# Patient Record
Sex: Female | Born: 1995 | Race: Black or African American | Hispanic: No | Marital: Single | Smoking: Never smoker
Health system: Southern US, Community
[De-identification: ages and names within clinical notes are randomized; demographics above are authoritative.]

## PROBLEM LIST (undated history)

## (undated) DIAGNOSIS — R278 Other lack of coordination: Secondary | ICD-10-CM

## (undated) HISTORY — DX: Other lack of coordination: R27.8

---
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
## 2016-01-15 DIAGNOSIS — Z0000 Encounter for general adult medical examination without abnormal findings: Secondary | ICD-10-CM | POA: Diagnosis not present

## 2016-02-27 ENCOUNTER — Ambulatory Visit (INDEPENDENT_AMBULATORY_CARE_PROVIDER_SITE_OTHER): Payer: BLUE CROSS/BLUE SHIELD | Admitting: Pediatrics

## 2016-02-27 ENCOUNTER — Encounter: Payer: Self-pay | Admitting: Pediatrics

## 2016-02-27 VITALS — BP 100/70 | Ht 66.5 in | Wt 157.0 lb

## 2016-02-27 DIAGNOSIS — F902 Attention-deficit hyperactivity disorder, combined type: Secondary | ICD-10-CM

## 2016-02-27 DIAGNOSIS — R278 Other lack of coordination: Secondary | ICD-10-CM | POA: Diagnosis not present

## 2016-02-27 MED ORDER — LISDEXAMFETAMINE DIMESYLATE 50 MG PO CAPS: ORAL_CAPSULE | ORAL | 0 refills | Status: DC

## 2016-02-27 NOTE — Progress Notes (Signed)
[facility]  [facility] [address] [place] [place] [address] Dept: [phone] Dept Fax: [phone] Loc: [phone] Loc Fax: [phone]  Medical Follow-up  Patient ID: Tina Moody, female  DOB: 1996-05-22, 20 y.o.  MRN: [id number]  Date of Evaluation: 02/27/16  PCP: Elon Jester, MD  Accompanied by: Father Patient Lives with: mother, father and [name] 10 years, Laurenity 6 years.  Patient's sisters godchildren.  Moved in for a time due to their family situation. Patient's parents are guardians, staying an indeterminate time since about December 2016. Still long term at this point.  HISTORY/CURRENT STATUS:  Polite and cooperative and present for three month follow up.   Freshman at [employer] will start on August 13th.  Needs letter form completed for DSO at college.   EDUCATION: School: [employer] freshman Year/Grade: Freshman Also looking for employment. Early childhood education [address], Reading and writing contemporary issues with lab Has chores at home  Services: IEP/504 Plan will apply for services in college Activities/Exercise:  Walking with Dad Has chores at home, looking for work.  Has beach trip planned [facility])  MEDICAL HISTORY: Appetite: WNL  Sleep: Bedtime: 2200 -0 2300 Awakens:0700 - 0800 Sleep Concerns: Initiation/Maintenance/Other: Asleep easily, sleeps through the night, feels well-rested.  No Sleep concerns. No concerns for toileting. Daily stool, no constipation or diarrhea. Void urine no difficulty. No enuresis.   Participate in daily oral hygiene to include brushing and flossing.  Individual Medical History/Review of System Changes? Had recent check up  Allergies: Review of patient's allergies indicates no known allergies.  Current Medications:    .  lisdexamfetamine (VYVANSE) 50 MG capsule, Two capsules by mouth daily  Medication Side Effects: None   Family  Medical/Social History Changes?: Yes Extended family living in home  MENTAL HEALTH: Mental Health Issues: Denies sadness, loneliness or depression. No self harm or thoughts of self harm or injury. Denies fears, worries and anxieties. Has good peer relations and is not a bully nor is victimized. Had call from Detectives about one week ago due to an online incident where patient was sent information and received a photo from a much older man on the Internet. Patient did not recall details. Parents were very concerned. Patient had website restrictions for a few years ago, so this happened during that time.  Has restrictions still based.  PHYSICAL EXAM: Vitals:  Today's Vitals   02/27/16 0957  BP: 100/70  Weight: 157 lb (71.2 kg)  Height: 5' 6.5" (1.689 m)  , 79 %ile (Z= 0.81) based on CDC 2-20 Years BMI-for-age data using vitals from 02/27/2016. Body mass index is 24.96 kg/m.  General Exam: Physical Exam  Constitutional: She is oriented to person, place, and time. Vital signs are normal. She appears well-developed and well-nourished. She is cooperative.  HENT:  Head: Normocephalic.  Right Ear: Tympanic membrane, external ear and ear canal normal.  Left Ear: Tympanic membrane, external ear and ear canal normal.  Nose: Nose normal.  Mouth/Throat: Uvula is midline, oropharynx is clear and moist and mucous membranes are normal.  Eyes: Conjunctivae, EOM and lids are normal. Pupils are equal, round, and reactive to light.  Neck: Trachea normal and normal range of motion. Neck supple. No thyroid mass present.  Cardiovascular: Normal rate, regular rhythm, normal heart sounds and normal pulses.   Pulmonary/Chest: Effort normal and breath sounds normal.  Abdominal: Normal appearance.  Genitourinary:  Genitourinary Comments: deferred  Musculoskeletal:       Right  elbow: She exhibits swelling.       Left elbow: She exhibits swelling.  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 She has normal strength and normal reflexes. She displays a negative Romberg sign.  Skin: Skin is warm, dry and intact.  Psychiatric: She has a normal mood and affect. Her speech is normal and behavior is normal. Judgment and thought content normal. Cognition and memory are normal.  Vitals reviewed.  Neurological: oriented to time, place, and person Cranial Nerves: normal  Neuromuscular:  Motor Mass: Normal Tone: Average  Strength: Good DTRs: 2+ and symmetric Overflow: None Reflexes: no tremors noted, finger to nose without dysmetria bilaterally, performs thumb to finger exercise without difficulty, no palmar drift, gait was normal, tandem gait was normal and no ataxic movements noted Sensory Exam: Vibratory: WNL  Fine Touch: WNL  Testing/Developmental Screens:  ASRS 26/19 per mother          DISCUSSION:  Reviewed old records and/or current chart. Reviewed growth and development with anticipatory guidance provided. Reviewed school progress and accommodations. Reviewed medication administration, effects, and possible side effects. ADHD medications discussed to include different medications and pharmacologic properties of each. Recommendation for specific medication to include dose, administration, expected effects, possible side effects and the risk to benefit ratio of medication management. Vyvanse  two daily. Three prescriptions provided, two with fill after dates for 03/19/16 and 04/09/16  Reviewed importance of good sleep hygiene, limited screen time, regular exercise and healthy eating.    DIAGNOSES:    ICD-9-CM ICD-10-CM   1. ADHD (attention deficit hyperactivity disorder), combined type 314.01 F90.2   2. Dysgraphia 781.3 R27.8     RECOMMENDATIONS:  Patient Instructions  Continue medication Vyvanse  - two capsules per day. Three prescriptions provided, two with fill after dates for 03/19/16 and 04/09/16.  Continue  daily exercise.  Excellent weight reduction since last visit.   Mother verbalized understanding of all topics discussed.   NEXT APPOINTMENT: Return in about 3 months (around 05/29/2016).  Medical Decision-making:  More than 50% of the appointment was spent counseling and discussing diagnosis and management of symptoms with the patient and family.  Leticia Penna, NP

## 2016-02-27 NOTE — Patient Instructions (Addendum)
Continue medication Vyvanse 50mg  - two capsules per day. Three prescriptions provided, two with fill after dates for 03/19/16 and 04/09/16.  Continue daily exercise.  Excellent weight reduction since last visit.

## 2016-05-02 DIAGNOSIS — L237 Allergic contact dermatitis due to plants, except food: Secondary | ICD-10-CM | POA: Diagnosis not present

## 2016-05-28 ENCOUNTER — Encounter: Payer: Self-pay | Admitting: Pediatrics

## 2016-05-28 ENCOUNTER — Ambulatory Visit (INDEPENDENT_AMBULATORY_CARE_PROVIDER_SITE_OTHER): Payer: BLUE CROSS/BLUE SHIELD | Admitting: Pediatrics

## 2016-05-28 VITALS — BP 120/80 | Ht 66.5 in | Wt 159.0 lb

## 2016-05-28 DIAGNOSIS — R278 Other lack of coordination: Secondary | ICD-10-CM | POA: Diagnosis not present

## 2016-05-28 DIAGNOSIS — F902 Attention-deficit hyperactivity disorder, combined type: Secondary | ICD-10-CM | POA: Diagnosis not present

## 2016-05-28 MED ORDER — LISDEXAMFETAMINE DIMESYLATE 50 MG PO CAPS: ORAL_CAPSULE | ORAL | 0 refills | Status: DC

## 2016-05-28 NOTE — Progress Notes (Signed)
[facility] [address] [place]. [address] [place] [address] Dept: [phone] Dept Fax: [phone] Loc: [phone] Loc Fax: [phone]  Medical Follow-up  Patient ID: Tina Moody, female  DOB: 18-Jul-1996, 20 y.o.  MRN: [id number]  Date of Evaluation: 05/28/16   PCP: Elon Jester, MD  Accompanied by: Tina Moody Patient Lives with: Tina Moody and Tina Moody  Sister's (35 and out of home) god children are living in the home Tina Moody is 10 years, and Tina Moody is 7 years Living with the family since about one year, sometime at her sister's. Has older brother (24 years)  HISTORY/CURRENT STATUS:  Polite and cooperative and present for three month follow up for routine medication management of ADHD.     EDUCATION: School: [employer]  Year/Grade: Freshman year  English on M, W, F, Early childhood educ T, Th and also Math Performance/Grades: below average teachers have not recorded all the grades Services: IEP/504 Plan DSO at college, can get help if needs help Activities/Exercise: never and likes to xbox "just dance"  MEDICAL HISTORY: Appetite: WNL  Sleep: Bedtime: 2200  Awakens: Up at 0620 on T/TH, later on MWF 0720 Sleep Concerns: Initiation/Maintenance/Other: Asleep easily, sleeps through the night, feels well-rested.  No Sleep concerns. Wants more sleep on M, W, F Has been late, did not miss Dad drives her to school.  Has her permit, not driving in a while  Individual Medical History/Review of System Changes? Yes Right ankle bug bite, had prednisone, now resolved.  Allergies: Review of patient's allergies indicates no known allergies.  Current Medications:  Current Outpatient Prescriptions:  .  Ibuprofen 200 MG CAPS, Take 200 mg by mouth as needed., Disp: , Rfl:  .  lisdexamfetamine (VYVANSE) 50 MG capsule, Two capsules by mouth daily, Disp: 60  capsule, Rfl: 0 .  VENTOLIN HFA 108 (90 Base) MCG/ACT inhaler, 2 PUFFS AS NEEDED EVERY 4 HRS INHALATION 30 DAYS, Disp: , Rfl: 0 Medication Side Effects: None  Family Medical/Social History Changes?: No  MENTAL HEALTH: Mental Health Issues:  Denies sadness, loneliness or depression. No self harm or thoughts of self harm or injury. Denies fears, worries and anxieties. Has good peer relations and is not a bully nor is victimized.   PHYSICAL EXAM: Vitals:  Today's Vitals   05/28/16 1658  BP: 120/80  Weight: 159 lb (72.1 kg)  Height: 5' 6.5" (1.689 m)  , 80 %ile (Z= 0.85) based on CDC 2-20 Years BMI-for-age data using vitals from 05/28/2016. Body mass index is 25.28 kg/m.  Review of Systems  Neurological: Negative for headaches.  Psychiatric/Behavioral: Negative for depression. The patient is not nervous/anxious.   All other systems reviewed and are negative.  General Exam: Physical Exam  Constitutional: She is oriented to person, place, and time. Vital signs are normal. She appears well-developed and well-nourished. She is cooperative. No distress.  HENT:  Head: Normocephalic.  Right Ear: Tympanic membrane, external ear and ear canal normal.  Left Ear: Tympanic membrane, external ear and ear canal normal.  Nose: Nose normal.  Mouth/Throat: Uvula is midline, oropharynx is clear and moist and mucous membranes are normal.  Eyes: Conjunctivae, EOM and lids are normal. Pupils are equal, round, and reactive to light.  Neck: Trachea normal and normal range of motion.  Cardiovascular: Normal rate, regular rhythm, normal heart sounds and normal pulses.   Pulmonary/Chest: Effort normal and breath sounds normal.  Abdominal: Soft. Normal appearance and bowel sounds are  normal.  Genitourinary:  Genitourinary Comments: Deferred  Musculoskeletal: Normal range of motion.  Neurological: She is alert and oriented to person, place, and time. She has normal strength and normal reflexes. She  displays no tremor. No cranial nerve deficit or sensory deficit. She displays a negative Romberg sign. She displays no seizure activity. Coordination and gait normal.  Skin: Skin is warm, dry and intact.  Psychiatric: She has a normal mood and affect. Her speech is normal and behavior is normal. Judgment and thought content normal. Her mood appears not anxious. She is not hyperactive. Cognition and memory are normal. She does not express impulsivity. She does not exhibit a depressed mood. She expresses no suicidal ideation. She expresses no suicidal plans. She is attentive.  Vitals reviewed.   Neurological: oriented to time, place, and person Cranial Nerves: normal  Neuromuscular:  Motor Mass: Normal Tone: Average  Strength: Good DTRs: 2+ and symmetric Overflow: None Reflexes: no tremors noted, finger to nose without dysmetria bilaterally, performs thumb to finger exercise without difficulty, no palmar drift, gait was normal, tandem gait was normal and no ataxic movements noted Sensory Exam: Vibratory: WNL  Fine Touch: WNL   Testing/Developmental Screens: ASRS 24/22       DISCUSSION:  Reviewed old records and/or current chart. Reviewed growth and development with anticipatory guidance provided. Reviewed school progress and accommodations. Has college services would benefit from understanding learn style and strengths.  Low to boarderline scores at testing done in 2010. Aspires for early childhood education. Reviewed medication administration, effects, and possible side effects.  ADHD medications discussed to include different medications and pharmacologic properties of each. Recommendation for specific medication to include dose, administration, expected effects, possible side effects and the risk to benefit ratio of medication management. Vyvanse 50 mg, two capsule each am. Reviewed importance of good sleep hygiene, limited screen time, regular exercise and healthy  eating.  DIAGNOSES:    ICD-9-CM ICD-10-CM   1. ADHD (attention deficit hyperactivity disorder), combined type 314.01 F90.2 Ambulatory referral to Pediatric Psychology  2. Dysgraphia 781.3 R27.8 Ambulatory referral to Pediatric Psychology    RECOMMENDATIONS:  Patient Instructions  Continue medication as directed.  Vyvanse 50 mg, two capsules each morning Three prescriptions provided, two with fill after dates for 06/18/16 and 07/09/16   Psychoeducational testing is recommended to either be updated to get a better understanding of learning style and strengths.   In addition, testing would allow the child to fully realize their potential which may be beneficial in motivating towards academic goals.     PHYSICAL ACTIVITY INFORMATION AND RESOURCES    It is important to know that:  . Nearly half of American youths aged 12-21 years are not vigorously active on a regular basis. . About 14 percent of young people report no recent physical activity. Inactivity is more common among females (14%) than males (7%) and among black females (21%) than white females (12%)  The Youth Physical Activity Guidelines are as follows: Children and adolescents should have 60 minutes (1 hour) or more of physical activity daily. . Aerobic: Most of the 60 or more minutes a day should be either moderate- or vigorous-intensity aerobic physical activity and should include vigorous-intensity physical activity at least 3 days a week. . Muscle-strengthening: As part of their 60 or more minutes of daily physical activity, children and adolescents should include muscle-strengthening physical activity on at least 3 days of the week. . Bone-strengthening: As part of their 60 or more minutes of daily physical activity,  children and adolescents should include bone-strengthening physical activity on at least 3 days of the week. This infographic provides examples of activities:   LumberShow.glhttp://health.gov/paguidelines/midcourse/youth-fact-sheet.pdf  Additional Information and Resources:  CoupleSeminar.co.nzhttp://www.cdc.gov/healthyschools/physicalactivity/guidelines.htm OrthoTraffic.chhttp://www.cdc.gov/nccdphp/sgr/adoles.htm ThemeLizard.nohttp://mchb.hrsa.gov/mchirc/_pubs/us_teens/main_pages/ch_2.htm https://www.mccoy-hunt.com/http://www.who.int/dietphysicalactivity/factsheet_young_people/en/ http://www.guthyjacksonfoundation.org/five-health-fitness-smartphone-apps-for-nmo/?gclid=CNTMuZvp3ccCFVc7gQod7HsAvw (phone apps)  Local Resources:  [place] [employer] [place] Youth Services Guide (Recreation and IT sales professionalxtra Curricular Activities on pages 30-33): http://www.[place].gov/modules/showdocument.aspx?documentid=18016 Summer Night Lights: http://www.[place]-.gov/index.aspx?page=4004  Go Far Club: BasicJet.cahttp://www.gofarclub.org/      Tina Moody and patient verbalized understanding all topics.   NEXT APPOINTMENT: Return in about 3 months (around 08/28/2016) for Medical Follow up.   Leticia PennaBobi A Isella Slatten, NP Counseling Time: 40 Total Contact Time: 50

## 2016-05-28 NOTE — Patient Instructions (Addendum)
Continue medication as directed.  Vyvanse 50 mg, two capsules each morning Three prescriptions provided, two with fill after dates for 06/18/16 and 07/09/16   Psychoeducational testing is recommended to either be updated to get a better understanding of learning style and strengths.   In addition, testing would allow the child to fully realize their potential which may be beneficial in motivating towards academic goals.     PHYSICAL ACTIVITY INFORMATION AND RESOURCES    It is important to know that:  . Nearly half of American youths aged 12-21 years are not vigorously active on a regular basis. . About 14 percent of young people report no recent physical activity. Inactivity is more common among females (14%) than males (7%) and among black females (21%) than white females (12%)  The Youth Physical Activity Guidelines are as follows: Children and adolescents should have 60 minutes (1 hour) or more of physical activity daily. . Aerobic: Most of the 60 or more minutes a day should be either moderate- or vigorous-intensity aerobic physical activity and should include vigorous-intensity physical activity at least 3 days a week. . Muscle-strengthening: As part of their 60 or more minutes of daily physical activity, children and adolescents should include muscle-strengthening physical activity on at least 3 days of the week. . Bone-strengthening: As part of their 60 or more minutes of daily physical activity, children and adolescents should include bone-strengthening physical activity on at least 3 days of the week. This infographic provides examples of activities:  LumberShow.glhttp://health.gov/paguidelines/midcourse/youth-fact-sheet.pdf  Additional Information and Resources:   CoupleSeminar.co.nzhttp://www.cdc.gov/healthyschools/physicalactivity/guidelines.htm OrthoTraffic.chhttp://www.cdc.gov/nccdphp/sgr/adoles.htm ThemeLizard.nohttp://mchb.hrsa.gov/mchirc/_pubs/us_teens/main_pages/ch_2.htm https://www.mccoy-hunt.com/http://www.who.int/dietphysicalactivity/factsheet_young_people/en/ http://www.guthyjacksonfoundation.org/five-health-fitness-smartphone-apps-for-nmo/?gclid=CNTMuZvp3ccCFVc7gQod7HsAvw (phone apps)  Local Resources:  [place] [employer] [place] Youth Services Guide (Recreation and IT sales professionalxtra Curricular Activities on pages 30-33): http://www.[place].gov/modules/showdocument.aspx?documentid=18016 Summer Night Lights: http://www.[place].gov/index.aspx?page=4004  Go Far Club: BasicJet.cahttp://www.gofarclub.org/

## 2016-07-31 ENCOUNTER — Encounter: Payer: Self-pay | Admitting: Pediatrics

## 2016-07-31 ENCOUNTER — Ambulatory Visit (INDEPENDENT_AMBULATORY_CARE_PROVIDER_SITE_OTHER): Payer: BLUE CROSS/BLUE SHIELD | Admitting: Pediatrics

## 2016-07-31 VITALS — BP 120/80 | Ht 66.5 in | Wt 156.0 lb

## 2016-07-31 DIAGNOSIS — F902 Attention-deficit hyperactivity disorder, combined type: Secondary | ICD-10-CM

## 2016-07-31 DIAGNOSIS — R278 Other lack of coordination: Secondary | ICD-10-CM | POA: Diagnosis not present

## 2016-07-31 DIAGNOSIS — Q999 Chromosomal abnormality, unspecified: Secondary | ICD-10-CM

## 2016-07-31 MED ORDER — LISDEXAMFETAMINE DIMESYLATE 50 MG PO CAPS: ORAL_CAPSULE | ORAL | 0 refills | Status: DC
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
## 2016-07-31 NOTE — Patient Instructions (Signed)
Continue medication as directed. Vyvanse 50 mg two daily Three prescriptions provided, two with fill after dates for 08/21/16 and 09/11/16  May need social skills training or counseling for social networking/situations. Parent/teen counseling is recommended and may include Family counseling.  Consider the following options: [facility]  http://famsolutions.org/ [phone]  [facility]  http://www.youthfocus.org/home.html 336 [phone]  Additional resources: COUNSELING AGENCIES in [place] (Accepting Medicaid)  [phone]- [phone] service coordination hub Provides information on mental health, intellectual/developmental disabilities & substance abuse services in [place]   [facility] [address]  "[facility]"           [phone] [facility] [address]          [phone] [facility] [address]            [phone]  [facility] [address]            [phone]  [facility] [address]           [phone] [facility] [phone]    [phone]

## 2016-08-09 ENCOUNTER — Telehealth: Payer: Self-pay | Admitting: Family

## 2016-08-09 MED ORDER — VYVANSE 50 MG PO CAPS: ORAL_CAPSULE | ORAL | 0 refills | Status: DC

## 2016-08-09 NOTE — Telephone Encounter (Signed)
T/C with mother regarding insurance coverage of # 60 capsules and resubmitted fax form today with required information completed. Mother to try to call insurance for approval of medication for # 60 and no call back yet . Will leave script for # 30 Vyvanse 50 mg 1 daily until insurance approval for # 60 capsules is completed at front desk with savings card.

## 2016-08-13 ENCOUNTER — Telehealth: Payer: Self-pay | Admitting: Pediatrics

## 2016-08-13 MED ORDER — VYVANSE 60 MG PO CAPS: 60.0000 mg | ORAL_CAPSULE | Freq: Every day | ORAL | 0 refills | Status: DC

## 2016-08-13 MED ORDER — VYVANSE 40 MG PO CAPS: 40.0000 mg | ORAL_CAPSULE | Freq: Every day | ORAL | 0 refills | Status: DC

## 2016-08-13 NOTE — Telephone Encounter (Signed)
Insurance denied vyvanse 50 mg, #60 caps Will try  vyvanse 40 mg, #30 vyvanse 60 mg #30 Printed and up front for mother to pick up

## 2016-08-20 ENCOUNTER — Other Ambulatory Visit: Payer: Self-pay | Admitting: Pediatrics

## 2016-08-20 MED ORDER — LISDEXAMFETAMINE DIMESYLATE 70 MG PO CAPS: 70.0000 mg | ORAL_CAPSULE | Freq: Every day | ORAL | 0 refills | Status: DC

## 2016-08-20 MED ORDER — LISDEXAMFETAMINE DIMESYLATE 30 MG PO CAPS
30.0000 mg | ORAL_CAPSULE | Freq: Every evening | ORAL | 0 refills | Status: DC
Start: 2016-08-20 — End: 2016-10-30

## 2016-08-20 NOTE — Telephone Encounter (Signed)
Mother would like RX for Vyvanse 70 mg for school days and 30 mg as needed for long college days or evening classes. Printed Rx and placed at front desk for pick-up

## 2016-08-21 ENCOUNTER — Telehealth: Payer: Self-pay | Admitting: Pediatrics

## 2016-08-21 NOTE — Telephone Encounter (Signed)
[facility] [phone]. Qty denied for two per day. PA for Vyvanse 70 mg one daily requested (#30). On call operator said "pending clinical review", I requested to speak with pharmacy review, transferred and into phone tree [phone])  PA # [id number] - when transferred this was not correct and they were unable to locate the patient with the member ID. Located by name and DOB, the member ID needed the Member code   ID# [id number]  Unable to approve, would not send to pharmacy for review.  Waste of time. On phone since 0945, time now [phone].  PA pending

## 2016-08-21 NOTE — Telephone Encounter (Signed)
Received fax from [facility] requesting prior authorization for Vyvanse 70 mg.  Patient last seen 07/31/16, next appointment 10/22/16.

## 2016-08-22 NOTE — Telephone Encounter (Signed)
Received denial.  Called [facility] started call at [phone] Received Approval after much hassle and needing to speak to a supervisor because [facility] did not enter the data correctly.  Approved Expired Jan 2019 PA [id number].

## 2016-08-27 ENCOUNTER — Encounter: Payer: Self-pay | Admitting: Pediatrics

## 2016-10-01 DIAGNOSIS — J029 Acute pharyngitis, unspecified: Secondary | ICD-10-CM | POA: Diagnosis not present

## 2016-10-01 DIAGNOSIS — B349 Viral infection, unspecified: Secondary | ICD-10-CM | POA: Diagnosis not present

## 2016-10-22 ENCOUNTER — Telehealth: Payer: Self-pay | Admitting: Pediatrics

## 2016-10-22 ENCOUNTER — Institutional Professional Consult (permissible substitution): Payer: BLUE CROSS/BLUE SHIELD | Admitting: Pediatrics

## 2016-10-22 NOTE — Telephone Encounter (Signed)
I called the home number and spoke with Dad. Pt is at school. Dad said they forgot about the apt and he was just looking up our number to call us to let us know that they forgot and will not be here for the apt. He rescheduled for next Wednesday at 3 pm. He is aware of the ns/-48 charge. jd

## 2016-10-30 ENCOUNTER — Ambulatory Visit (INDEPENDENT_AMBULATORY_CARE_PROVIDER_SITE_OTHER): Payer: BLUE CROSS/BLUE SHIELD | Admitting: Pediatrics

## 2016-10-30 ENCOUNTER — Encounter: Payer: Self-pay | Admitting: Pediatrics

## 2016-10-30 VITALS — BP 130/97 | HR 90 | Ht 66.0 in | Wt 159.0 lb

## 2016-10-30 DIAGNOSIS — F902 Attention-deficit hyperactivity disorder, combined type: Secondary | ICD-10-CM

## 2016-10-30 DIAGNOSIS — R278 Other lack of coordination: Secondary | ICD-10-CM | POA: Diagnosis not present

## 2016-10-30 MED ORDER — LISDEXAMFETAMINE DIMESYLATE 70 MG PO CAPS: 70.0000 mg | ORAL_CAPSULE | Freq: Every day | ORAL | 0 refills | Status: DC

## 2016-10-30 NOTE — Progress Notes (Signed)
[facility] [address], [place]. [address] [place] [address] Dept: [phone] Dept Fax: [phone] Loc: [phone] Loc Fax: [phone]  Medical Follow-up  Patient ID: Tina Moody, female  DOB: June 29, 1996, 21 y.o.  MRN: [id number]  Date of Evaluation: 10/30/16   PCP: Leanor Rubenstein, MD  Accompanied by: Father Patient Lives with: mother, father and [name] 10 years and [name] 7 years (Sister's god children)  During the school week the kids are with this family, often they are at her sister's.  HISTORY/CURRENT STATUS:  Polite and cooperative and present for three month follow up for routine medication management of ADHD. Currently taking Vyvanse 70 mg daily, unable to obtain #60 per month due to insurance restrictions.  Needs better coverage to inlcude either 50, two daily or 70/30 daily. Currently staying with Celine Ahr, mother is on cruise and father has to work.  Going to beach with her sister and the children for the weekend.    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  MEDICAL HISTORY: Appetite: WNL  Sleep: Bedtime: 2400 no school, school nights change and goes to bed by 2200 Awakens: later on breaks, often depends on what time she went to bed. Sleep Concerns: Initiation/Maintenance/Other: Asleep easily, sleeps through the night, feels well-rested.  No Sleep concerns. Some night awakens, no reason can return back to bed. No concerns for toileting. Daily stool, no constipation or diarrhea. Void urine no difficulty. No enuresis.   Participate in daily oral hygiene to include brushing and flossing.  Individual Medical History/Review of System Changes? Yes was sic and had PCP visit in  March, no meds.  No notes to review.  Was not flu or strep, most just URI  Allergies: Patient has no known allergies.  Current Medications:  Current Outpatient Prescriptions:  .  Ibuprofen 200 MG CAPS, Take 200 mg by mouth as needed., Disp: , Rfl:  .  lisdexamfetamine (VYVANSE) 70 MG capsule, Take 1 capsule (70 mg total) by mouth daily., Disp: 30 capsule, Rfl: 0 .  VENTOLIN HFA 108 (90 Base) MCG/ACT inhaler, 2 PUFFS AS NEEDED EVERY 4 HRS INHALATION 30 DAYS, Disp: , Rfl: 0 Medication Side Effects: None  Family Medical/Social History Changes?: No  MENTAL HEALTH: Mental Health Issues:  Denies sadness, loneliness or depression. No self harm or thoughts of self harm or injury. Denies fears, worries and anxieties. Has good peer relations and is not a bully nor is victimized. Wants to have more independence and "talking to people" on line, had boyfriend mother didn't know about.  Her discussion of what a friend and boyfriend are, seems a bit immature.  Has friends at school. Hangs at home and with family. Aspires to own a daycare  Review of Systems  Musculoskeletal: Positive for joint swelling.       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 Occasional feet  Neurological: Negative for tremors, seizures, weakness and headaches.  Psychiatric/Behavioral: Negative for dysphoric mood, sleep disturbance and suicidal ideas. The patient is not nervous/anxious.   All other systems reviewed and are negative.   PHYSICAL EXAM: Vitals:  Today's Vitals   10/30/16 1511  BP: (!) 130/97  Pulse: 90  Weight: 159 lb (72.1 kg)  Height:  (1.676 m)  , Facility age limit for growth percentiles  is 20 years.  Body mass index is 25.66 kg/m.   General Exam: Physical Exam  Constitutional: She is oriented to person, place, and time. Vital signs are normal. She appears well-developed and well-nourished. She is cooperative. No distress.  HENT:  Head: Normocephalic.  Right Ear: Tympanic membrane,  external ear and ear canal normal.  Left Ear: Tympanic membrane, external ear and ear canal normal.  Nose: Nose normal.  Mouth/Throat: Uvula is midline, oropharynx is clear and moist and mucous membranes are normal.  Eyes: Conjunctivae, EOM and lids are normal. Pupils are equal, round, and reactive to light.  Neck: Trachea normal and normal range of motion.  Cardiovascular: Normal rate, regular rhythm, normal heart sounds and normal pulses.   Pulmonary/Chest: Effort normal and breath sounds normal.  Abdominal: Soft. Normal appearance and bowel sounds are normal.  Genitourinary:  Genitourinary Comments: Deferred  Musculoskeletal: Normal range of motion.  Neurological: She is alert and oriented to person, place, and time. She has normal strength and normal reflexes. She displays no tremor. No cranial nerve deficit or sensory deficit. She displays a negative Romberg sign. She displays no seizure activity. Coordination and gait normal.  Skin: Skin is warm, dry and intact.  Psychiatric: She has a normal mood and affect. Her speech is normal and behavior is normal. Judgment and thought content normal. Her mood appears not anxious. She is not hyperactive. Cognition and memory are normal. She does not express impulsivity. She does not exhibit a depressed mood. She expresses no suicidal ideation. She expresses no suicidal plans. She is attentive.  Vitals reviewed.   Neurological: oriented to time, place, and person Cranial Nerves: normal  Neuromuscular:  Motor Mass: Normal Tone: Average  Strength: Good DTRs: 2+ and symmetric Overflow: None Reflexes: no tremors noted, finger to nose without dysmetria bilaterally, performs thumb to finger exercise without difficulty, no palmar drift, gait was normal, tandem gait was normal and no ataxic movements noted Sensory Exam: Vibratory: WNL  Fine Touch: WNL  Developmental testing: ASRS: 20/15   DISCUSSION:  Reviewed old records and/or current  chart.  Reviewed growth and development with anticipatory guidance provided. Consider social skills, self-defense and counseling due to social emotional immaturity. Has restrictions such as not being allowed to be home alone, due to immature choices.  Seems okay with this.  Reviewed school progress and accommodations.  Reviewed medication administration, effects, and possible side effects.  ADHD medications discussed to include different medications and pharmacologic properties of each. Recommendation for specific medication to include dose, administration, expected effects, possible side effects and the risk to benefit ratio of medication management. Vyvanse 70 mg  Reviewed importance of good sleep hygiene, limited screen time, regular exercise and healthy eating.   DIAGNOSES:    ICD-9-CM ICD-10-CM   1. ADHD (attention deficit hyperactivity disorder), combined type 314.01 F90.2   2. Dysgraphia 781.3 R27.8     RECOMMENDATIONS:  Patient Instructions  Continue medication as directed. Vyvanse 30 mg daily Three prescriptions provided, two with fill after dates for 11/20/16 and 12/11/16  PHYSICAL ACTIVITY INFORMATION AND RESOURCES    It is important to know that:  . Nearly half of American youths aged 12-21 years are not vigorously active on a regular basis. . About 14 percent of young people report no recent physical activity. Inactivity is more common among females (14%) than males (7%) and among black females (21%) than white females (12%)  The Youth Physical Activity Guidelines are as follows: Children and adolescents should have 60 minutes (  1 hour) or more of physical activity daily. . Aerobic: Most of the 60 or more minutes a day should be either moderate- or vigorous-intensity aerobic physical activity and should include vigorous-intensity physical activity at least 3 days a week. . Muscle-strengthening: As part of their 60 or more minutes of daily physical activity, children and  adolescents should include muscle-strengthening physical activity on at least 3 days of the week. . Bone-strengthening: As part of their 60 or more minutes of daily physical activity, children and adolescents should include bone-strengthening physical activity on at least 3 days of the week. This infographic provides examples of activities:  LumberShow.gl.pdf  Additional Information and Resources:  CoupleSeminar.co.nz.htm OrthoTraffic.ch.htm ThemeLizard.no https://www.mccoy-hunt.com/ http://www.guthyjacksonfoundation.org/five-health-fitness-smartphone-apps-for-nmo/?gclid=CNTMuZvp3ccCFVc7gQod7HsAvw (phone apps)  Local Resources:  [place] [employer] Guide (Recreation and IT sales professional Activities on pages 30-33): http://www.[place].gov/modules/showdocument.aspx?documentid=18016 Summer Night Lights: http://www.[place].gov/index.aspx?page=4004  Go Far Club: BasicJet.ca     Father verbalized understanding of all topics discussed.   NEXT APPOINTMENT: Return in about 3 months (around 01/29/2017) for Medical Follow up. Medical Decision-making: More than 50% of the appointment was spent counseling and discussing diagnosis and management of symptoms with the patient and family.   Leticia Penna, NP Counseling Time: 40 Total Contact Time: 50

## 2016-10-30 NOTE — Patient Instructions (Signed)
Continue medication as directed. Vyvanse 30 mg daily Three prescriptions provided, two with fill after dates for 11/20/16 and 12/11/16  PHYSICAL ACTIVITY INFORMATION AND RESOURCES    It is important to know that:  . Nearly half of American youths aged 20-21 years are not vigorously active on a regular basis. . About 14 percent of young people report no recent physical activity. Inactivity is more common among females (14%) than males (7%) and among black females (21%) than white females (12%)  The Youth Physical Activity Guidelines are as follows: Children and adolescents should have 60 minutes (1 hour) or more of physical activity daily. . Aerobic: Most of the 60 or more minutes a day should be either moderate- or vigorous-intensity aerobic physical activity and should include vigorous-intensity physical activity at least 3 days a week. . Muscle-strengthening: As part of their 60 or more minutes of daily physical activity, children and adolescents should include muscle-strengthening physical activity on at least 3 days of the week. . Bone-strengthening: As part of their 60 or more minutes of daily physical activity, children and adolescents should include bone-strengthening physical activity on at least 3 days of the week. This infographic provides examples of activities:  LumberShow.gl.pdf  Additional Information and Resources:  CoupleSeminar.co.nz.htm OrthoTraffic.ch.htm ThemeLizard.no https://www.mccoy-hunt.com/ http://www.guthyjacksonfoundation.org/five-health-fitness-smartphone-apps-for-nmo/?gclid=CNTMuZvp3ccCFVc7gQod7HsAvw (phone apps)  Local Resources:  [place] [employer] Guide (Recreation and IT sales professional Activities on pages 30-33):  http://www.[place].gov/modules/showdocument.aspx?documentid=18016 Summer Night Lights: http://www.[place].gov/index.aspx?page=4004  Go Far Club: BasicJet.ca

## 2017-01-28 ENCOUNTER — Other Ambulatory Visit: Payer: Self-pay | Admitting: Pediatrics

## 2017-01-28 MED ORDER — LISDEXAMFETAMINE DIMESYLATE 70 MG PO CAPS: 70.0000 mg | ORAL_CAPSULE | Freq: Every day | ORAL | 0 refills | Status: DC

## 2017-01-28 NOTE — Telephone Encounter (Signed)
Mom called for refill, did not specify medication.  Needs as soon as possible.  Patient last seen 10/30/16, next appointment 02/06/17.

## 2017-01-28 NOTE — Telephone Encounter (Signed)
Printed Rx and placed at front desk for pick-up-Vyvanse 70 mg daily 

## 2017-02-06 ENCOUNTER — Encounter: Payer: Self-pay | Admitting: Pediatrics

## 2017-02-06 ENCOUNTER — Ambulatory Visit (INDEPENDENT_AMBULATORY_CARE_PROVIDER_SITE_OTHER): Payer: BLUE CROSS/BLUE SHIELD | Admitting: Pediatrics

## 2017-02-06 VITALS — BP 133/88 | HR 108 | Ht 66.75 in | Wt 159.0 lb

## 2017-02-06 DIAGNOSIS — Z719 Counseling, unspecified: Secondary | ICD-10-CM | POA: Diagnosis not present

## 2017-02-06 DIAGNOSIS — R278 Other lack of coordination: Secondary | ICD-10-CM | POA: Diagnosis not present

## 2017-02-06 DIAGNOSIS — Q999 Chromosomal abnormality, unspecified: Secondary | ICD-10-CM | POA: Diagnosis not present

## 2017-02-06 DIAGNOSIS — F902 Attention-deficit hyperactivity disorder, combined type: Secondary | ICD-10-CM | POA: Diagnosis not present

## 2017-02-06 MED ORDER — LISDEXAMFETAMINE DIMESYLATE 70 MG PO CAPS: 70.0000 mg | ORAL_CAPSULE | Freq: Every day | ORAL | 0 refills | Status: DC

## 2017-02-06 NOTE — Progress Notes (Signed)
[facility] [address], [place] [place] [address] Dept: [phone] Dept Fax: [phone] Loc: [phone] Loc Fax: [phone]  Medical Follow-up  Patient ID: Tina BladeGenesis R Bethune, female  DOB: [birth year], 21 y.o.  MRN: [id number]  Date of Evaluation: 02/06/17   PCP: [name], Vyvyan, MD  Accompanied by: self Patient Lives with: mother, father and Rainy 7, Malichai 11 years  HISTORY/CURRENT STATUS:  Chief Complaint - Polite and cooperative and present for medical follow up for medication management of ADHD, dysgraphia and learning differences. Last visit in April 2018 and prescribed Vyvanse 70 mg daily. Discussed grades and issues in college.  Patient is not sure if she was actually receiving credit for F grades.     EDUCATION: School: [employer] classes last spring Math Early Childhood  Did not Pass, but did not do well, did not get credit But "they are letting me take more classes next semester"  On-line transcript shows F grades and under "credit" shows 3 per class, not sure if that means they were earned or not Transcript on phone is not the best.  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  MEDICAL HISTORY: Appetite: WNL  Sleep: Bedtime: 2200 Awakens: 0800 Sleep Concerns: Initiation/Maintenance/Other: Asleep easily, sleeps through the night, feels well-rested.  No Sleep concerns. No concerns for toileting. Daily stool, no constipation or diarrhea. Void urine no difficulty. No enuresis.   Participate in daily oral hygiene to include brushing and flossing.  Individual Medical History/Review of System Changes? No  Allergies: Patient has no known allergies.  Current Medications:  Current Outpatient Prescriptions:  .  lisdexamfetamine (VYVANSE) 70 MG capsule, Take 1 capsule (70 mg total) by  mouth daily., Disp: 30 capsule, Rfl: 0 .  Ibuprofen 200 MG CAPS, Take 200 mg by mouth as needed., Disp: , Rfl:  .  VENTOLIN HFA 108 (90 Base) MCG/ACT inhaler, 2 PUFFS AS NEEDED EVERY 4 HRS INHALATION 30 DAYS, Disp: , Rfl: 0 Medication Side Effects: None  Family Medical/Social History Changes?: No  MENTAL HEALTH: Mental Health Issues: Denies sadness, loneliness or depression. No self harm or thoughts of self harm or injury. Denies fears, worries and anxieties. Has good peer relations and is not a bully nor is victimized.  Review of Systems  Constitutional: Negative.   HENT: Negative.   Cardiovascular: Negative.   Gastrointestinal: Negative.   Endocrine: Negative.   Genitourinary: Negative.   Musculoskeletal: Positive for joint swelling.  Skin: Negative.   Allergic/Immunologic: Negative.   Neurological: Negative for seizures and headaches.  Hematological: Negative.   Psychiatric/Behavioral: Positive for decreased concentration. Negative for behavioral problems, dysphoric mood, self-injury, sleep disturbance and suicidal ideas. The patient is not nervous/anxious and is not hyperactive.   All other systems reviewed and are negative.   PHYSICAL EXAM: Vitals:  Today's Vitals   02/06/17 0954  BP: 133/88  Pulse: (!) 108  Weight: 159 lb (72.1 kg)  Height: 5' 6.75" (1.695 m)  , Facility age limit for growth percentiles is 20 years. Body mass index is 25.09 kg/m.  General Exam: Physical Exam  Constitutional: She is oriented to person, place, and time. Vital signs are normal. She appears well-developed and well-nourished. She is cooperative. No distress.  HENT:  Head: Normocephalic.  Right Ear: Tympanic membrane, external ear and ear canal normal.  Left Ear: Tympanic membrane, external ear and ear canal normal.  Nose: Nose normal.  Mouth/Throat: Uvula is midline, oropharynx is clear and moist and mucous membranes are normal.  Eyes: Pupils are equal, round, and reactive to light.  Conjunctivae, EOM and lids are normal.  Neck: Trachea normal and normal range of motion.  Cardiovascular: Normal rate, regular rhythm, normal heart sounds and normal pulses.   Pulmonary/Chest: Effort normal and breath sounds normal.  Abdominal: Soft. Normal appearance and bowel sounds are normal.  Genitourinary:  Genitourinary Comments: Deferred  Musculoskeletal: Normal range of motion.  Lymphadenopathy:       Right: Epitrochlear adenopathy present.       Left: Epitrochlear adenopathy present.  Bilateral foot swelling  Neurological: She is alert and oriented to person, place, and time. She has normal strength and normal reflexes. She displays no tremor. No cranial nerve deficit or sensory deficit. She displays a negative Romberg sign. She displays no seizure activity. Coordination and gait normal.  Skin: Skin is warm, dry and intact.  Psychiatric: She has a normal mood and affect. Her speech is normal and behavior is normal. Judgment and thought content normal. Her mood appears not anxious. She is not hyperactive. Cognition and memory are normal. She does not express impulsivity. She does not exhibit a depressed mood. She expresses no suicidal ideation. She expresses no suicidal plans. She is attentive.  Vitals reviewed.   Neurological: oriented to time, place, and person  Testing/Developmental Screens: ZOX:WRUE 17/13  Reviewed with patient      DIAGNOSES:    ICD-10-CM   1. ADHD (attention deficit hyperactivity disorder), combined type F90.2   2. 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 Patient counseled Z71.9     RECOMMENDATIONS:  Patient Instructions  DISCUSSION: Patient and family counseled regarding the following coordination of care items:  Continue medication  Vyvanse 70 mg daily, every morning Three prescriptions provided, two with fill after dates for 02/26/17 and 03/19/17  Counseled medication administration, effects, and possible side effects.  ADHD  medications discussed to include different medications and pharmacologic properties of each. Recommendation for specific medication to include dose, administration, expected effects, possible side effects and the risk to benefit ratio of medication management.  Advised importance of:  Good sleep hygiene (8- 10 hours per night) Limited screen time (none on school nights, no more than 2 hours on weekends) Regular exercise(outside and active play) Healthy eating (drink water, no sodas/sweet tea, limit portions and no seconds).  Counseled regarding brain maturation and development.  Encouraged patient to speak with college advisor regarding grades and whether she earned the credits.  Counseled regarding safe driving, no distractions and no phones.      Patient verbalized understanding of all topics discussed.   NEXT APPOINTMENT: Return in about 3 months (around 05/09/2017) for Medical Follow up. Medical Decision-making: More than 50% of the appointment was spent counseling and discussing diagnosis and management of symptoms with the patient and family.   Leticia Penna, NP Counseling Time: 40 Total Contact Time: 50

## 2017-02-06 NOTE — Patient Instructions (Addendum)
DISCUSSION: Patient and family counseled regarding the following coordination of care items:  Continue medication  Vyvanse 70 mg daily, every morning Three prescriptions provided, two with fill after dates for 02/26/17 and 03/19/17  Counseled medication administration, effects, and possible side effects.  ADHD medications discussed to include different medications and pharmacologic properties of each. Recommendation for specific medication to include dose, administration, expected effects, possible side effects and the risk to benefit ratio of medication management.  Advised importance of:  Good sleep hygiene (8- 10 hours per night) Limited screen time (none on school nights, no more than 2 hours on weekends) Regular exercise(outside and active play) Healthy eating (drink water, no sodas/sweet tea, limit portions and no seconds).  Counseled regarding brain maturation and development.  Encouraged patient to speak with college advisor regarding grades and whether she earned the credits.  Counseled regarding safe driving, no distractions and no phones.

## 2017-04-15 DIAGNOSIS — Z118 Encounter for screening for other infectious and parasitic diseases: Secondary | ICD-10-CM | POA: Diagnosis not present

## 2017-04-15 DIAGNOSIS — Z01419 Encounter for gynecological examination (general) (routine) without abnormal findings: Secondary | ICD-10-CM | POA: Diagnosis not present

## 2017-04-15 DIAGNOSIS — Z6826 Body mass index (BMI) 26.0-26.9, adult: Secondary | ICD-10-CM | POA: Diagnosis not present

## 2017-05-27 ENCOUNTER — Ambulatory Visit (INDEPENDENT_AMBULATORY_CARE_PROVIDER_SITE_OTHER): Payer: BLUE CROSS/BLUE SHIELD | Admitting: Pediatrics

## 2017-05-27 ENCOUNTER — Encounter: Payer: Self-pay | Admitting: Pediatrics

## 2017-05-27 VITALS — BP 149/97 | HR 109 | Ht 66.25 in | Wt 164.0 lb

## 2017-05-27 DIAGNOSIS — Z719 Counseling, unspecified: Secondary | ICD-10-CM

## 2017-05-27 DIAGNOSIS — F902 Attention-deficit hyperactivity disorder, combined type: Secondary | ICD-10-CM

## 2017-05-27 DIAGNOSIS — Z7189 Other specified counseling: Secondary | ICD-10-CM | POA: Diagnosis not present

## 2017-05-27 DIAGNOSIS — Q999 Chromosomal abnormality, unspecified: Secondary | ICD-10-CM

## 2017-05-27 DIAGNOSIS — R278 Other lack of coordination: Secondary | ICD-10-CM | POA: Diagnosis not present

## 2017-05-27 DIAGNOSIS — Z79899 Other long term (current) drug therapy: Secondary | ICD-10-CM | POA: Diagnosis not present

## 2017-05-27 MED ORDER — LISDEXAMFETAMINE DIMESYLATE 70 MG PO CAPS: 70.0000 mg | ORAL_CAPSULE | Freq: Every day | ORAL | 0 refills | Status: DC

## 2017-05-27 NOTE — Progress Notes (Signed)
[facility] [place]. [address] [place] [address] Dept: [phone] Dept Fax: [phone] Loc: [phone] Loc Fax: [phone]  Medical Follow-up  Patient ID: Tina Moody, female  DOB: February 11, 1996, 21 y.o.  MRN: [id number]  Date of Evaluation: 05/27/17   PCP: Donald Prose, MD  Accompanied by: self Patient Lives with: mother, father and Rainy 8, Malichai 12 years  HISTORY/CURRENT STATUS:  Chief Complaint - Polite and cooperative and present for medical follow up for medication management of ADHD, dysgraphia and learning differences.  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 Last visit in July 2018 and prescribed Vyvanse 70 mg daily.      EDUCATION: School: [employer] classes ENG 111, Health/Safety and Nutrition and Human Relations/community Was doing child devo 2, but dropped the class due to 4 classes too much Per patient - "Eng - good HSN- good HR -great"  Asked to see the On-line transcript and it shows - ENG 63.4, HSN 54.9  And HR 32.9 She stays for tutoring and extra help. 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 Wants to work at [employer] day [employer]. Has orientation tomorrow and Thursday  MEDICAL HISTORY: Appetite: WNL  Sleep: Bedtime: 2200 Awakens: 0800 Sleep Concerns: Initiation/Maintenance/Other: Asleep easily, sleeps through the night, feels well-rested.  No Sleep concerns. No concerns for toileting. Daily stool, no constipation or diarrhea. Void urine no difficulty. No enuresis.   Participate in daily oral hygiene to include brushing and flossing.  Individual Medical History/Review of System Changes? Yes had OB/Gyn check up.  Allergies: Patient has no known allergies.  Current Medications:  Vyvanse 70  mg Requests no changes. Medication Side Effects: None  Family Medical/Social History Changes?: No  MENTAL HEALTH: Mental Health Issues:  Denies sadness, loneliness or depression. No self harm or thoughts of self harm or injury. Denies fears, worries and anxieties. Has good peer relations and is not a bully nor is victimized.   Review of Systems  Constitutional: Negative.   HENT: Negative.   Cardiovascular: Negative.   Gastrointestinal: Negative.   Endocrine: Negative.   Genitourinary: Negative.   Musculoskeletal: Positive for joint swelling.  Skin: Negative.   Allergic/Immunologic: Negative.   Neurological: Negative for seizures and headaches.  Hematological: Negative.   Psychiatric/Behavioral: Positive for decreased concentration. Negative for behavioral problems, dysphoric mood, self-injury, sleep disturbance and suicidal ideas. The patient is not nervous/anxious and is not hyperactive.   All other systems reviewed and are negative.  Has boyfriend - Merrilee Seashore, met at school Driving is going well - got speeding ticket 79 in a 60.  Wasn't looking down at speed.  Lawyer got a continuance with a new court date next month.  PHYSICAL EXAM: Vitals:  Today's Vitals   05/27/17 1404  BP: (!) 149/97  Pulse: (!) 109  Weight: 164 lb (74.4 kg)  Height: 5' 6.25" (1.683 m)  , Facility age limit for growth percentiles is 20 years. Body mass index is 26.27 kg/m.  General Exam: Physical Exam  Constitutional: She is oriented to person, place, and time. Vital signs are normal. She appears well-developed and well-nourished. She is cooperative. No distress.  HENT:  Head: Normocephalic.  Right Ear: Tympanic membrane, external ear and ear canal normal.  Left Ear: Tympanic membrane, external ear and ear canal normal.  Nose: Nose normal.  Mouth/Throat: Uvula is midline, oropharynx is clear and moist and mucous membranes are normal.  Eyes: Pupils are equal, round, and reactive to light.  Conjunctivae, EOM and lids are normal.  Neck: Trachea normal and normal range of motion.  Cardiovascular: Normal rate, regular rhythm, normal heart sounds and normal pulses.   Pulmonary/Chest: Effort normal and breath sounds normal.  Abdominal: Soft. Normal appearance and bowel sounds are normal.  Genitourinary:  Genitourinary Comments: Deferred  Musculoskeletal: Normal range of motion.  Neurological: She is alert and oriented to person, place, and time. She has normal strength and normal reflexes. She displays no tremor. No cranial nerve deficit or sensory deficit. She displays a negative Romberg sign. She displays no seizure activity. Coordination and gait normal.  Skin: Skin is warm, dry and intact.  Psychiatric: She has a normal mood and affect. Her speech is normal and behavior is normal. Judgment and thought content normal. Her mood appears not anxious. She is not hyperactive. Cognition and memory are normal. She does not express impulsivity. She does not exhibit a depressed mood. She expresses no suicidal ideation. She expresses no suicidal plans. She is attentive.  Vitals reviewed.   Neurological: oriented to time, place, and person  Testing/Developmental Screens: SAY:TKZS 20/14 Reviewed with patient       DIAGNOSES:    ICD-10-CM   1. ADHD (attention deficit hyperactivity disorder), combined type F90.2   2. 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 Medication management Z79.899   5. Counseling and coordination of care Z71.89   6. Patient counseled Z71.9     RECOMMENDATIONS:  Patient Instructions  DISCUSSION: Patient counseled regarding the following coordination of care items:  Continue medication as directed Vyvanse 70 mg daily Three prescriptions provided, two with fill after dates for 06/17/17 and 07/10/17   Counseled medication administration, effects, and possible side effects.  ADHD medications discussed to include different medications and  pharmacologic properties of each. Recommendation for specific medication to include dose, administration, expected effects, possible side effects and the risk to benefit ratio of medication management.  Advised importance of:  Good sleep hygiene (8- 10 hours per night) Limited screen time (none on school nights, no more than 2 hours on weekends) Regular exercise(outside and active play) Healthy eating (drink water, no sodas/sweet tea, limit portions and no seconds).  Counseling at this visit included the review of old records and/or current chart with the patient and family.   Counseling included the following discussion points:  Recent health history and today's examination Growth and development with anticipatory guidance provided regarding brain growth, executive function maturation and pubertal development School progress and continued advocay for appropriate accommodations to include maintain Structure, routine, organization, reward, motivation and consequences. Additionally discussed need for tutoring and bringing up low grades.  Request meeting with professors, go to office hours, etc.  Patient verbalized understanding of all topics discussed.   NEXT APPOINTMENT: Return in about 3 months (around 08/27/2017) for Medical Follow up. Medical Decision-making: More than 50% of the appointment was spent counseling and discussing diagnosis and management of symptoms with the patient and family.   Len Childs, NP Counseling Time: 40 Total Contact Time: 50

## 2017-05-27 NOTE — Patient Instructions (Addendum)
DISCUSSION: Patient counseled regarding the following coordination of care items:  Continue medication as directed Vyvanse 70 mg daily Three prescriptions provided, two with fill after dates for 06/17/17 and 07/10/17   Counseled medication administration, effects, and possible side effects.  ADHD medications discussed to include different medications and pharmacologic properties of each. Recommendation for specific medication to include dose, administration, expected effects, possible side effects and the risk to benefit ratio of medication management.  Advised importance of:  Good sleep hygiene (8- 10 hours per night) Limited screen time (none on school nights, no more than 2 hours on weekends) Regular exercise(outside and active play) Healthy eating (drink water, no sodas/sweet tea, limit portions and no seconds).  Counseling at this visit included the review of old records and/or current chart with the patient and family.   Counseling included the following discussion points:  Recent health history and today's examination Growth and development with anticipatory guidance provided regarding brain growth, executive function maturation and pubertal development School progress and continued advocay for appropriate accommodations to include maintain Structure, routine, organization, reward, motivation and consequences. Additionally discussed need for tutoring and bringing up low grades.  Request meeting with professors, go to office hours, etc.

## 2017-05-30 DIAGNOSIS — Z111 Encounter for screening for respiratory tuberculosis: Secondary | ICD-10-CM | POA: Diagnosis not present

## 2017-06-08 DIAGNOSIS — R0789 Other chest pain: Secondary | ICD-10-CM | POA: Diagnosis not present

## 2017-06-08 DIAGNOSIS — R0602 Shortness of breath: Secondary | ICD-10-CM | POA: Diagnosis not present

## 2017-06-08 DIAGNOSIS — Z79899 Other long term (current) drug therapy: Secondary | ICD-10-CM | POA: Diagnosis not present

## 2017-06-08 DIAGNOSIS — R079 Chest pain, unspecified: Secondary | ICD-10-CM | POA: Diagnosis not present

## 2017-06-08 DIAGNOSIS — F419 Anxiety disorder, unspecified: Secondary | ICD-10-CM | POA: Diagnosis not present

## 2017-06-08 DIAGNOSIS — R9431 Abnormal electrocardiogram [ECG] [EKG]: Secondary | ICD-10-CM | POA: Diagnosis not present

## 2017-06-08 DIAGNOSIS — F909 Attention-deficit hyperactivity disorder, unspecified type: Secondary | ICD-10-CM | POA: Diagnosis not present

## 2017-06-08 DIAGNOSIS — R000 Tachycardia, unspecified: Secondary | ICD-10-CM | POA: Diagnosis not present

## 2017-06-24 DIAGNOSIS — J029 Acute pharyngitis, unspecified: Secondary | ICD-10-CM | POA: Diagnosis not present

## 2017-06-24 DIAGNOSIS — J101 Influenza due to other identified influenza virus with other respiratory manifestations: Secondary | ICD-10-CM | POA: Diagnosis not present

## 2017-08-22 ENCOUNTER — Other Ambulatory Visit: Payer: Self-pay | Admitting: Pediatrics

## 2017-08-22 ENCOUNTER — Telehealth: Payer: Self-pay | Admitting: Pediatrics

## 2017-08-22 MED ORDER — LISDEXAMFETAMINE DIMESYLATE 70 MG PO CAPS: 70.0000 mg | ORAL_CAPSULE | Freq: Every day | ORAL | 0 refills | Status: DC

## 2017-08-22 NOTE — Telephone Encounter (Signed)
Printed Rx and placed at front desk for pick-up-Vyvanse 70 mg daily 

## 2017-08-22 NOTE — Telephone Encounter (Signed)
Note    Mom called in for a refill request for Vyvanse  70 mg capsule with no changes.Patient has appointment on 09/02/17  With Bobi

## 2017-08-22 NOTE — Telephone Encounter (Signed)
Mom called in for a refill request for Vyvanse  70 mg capsule with no changes.Patient has appointment on 09/02/17  With Bobi.

## 2017-08-26 ENCOUNTER — Other Ambulatory Visit: Payer: Self-pay | Admitting: Pediatrics

## 2017-08-26 NOTE — Telephone Encounter (Signed)
Duplicate, medication Rx has been filled and is at front.

## 2017-08-26 NOTE — Telephone Encounter (Signed)
Patient called for refill, did not specify medication.  Patient last seen 05/27/17, next appointment 09/02/17.

## 2017-08-28 ENCOUNTER — Telehealth: Payer: Self-pay | Admitting: Pediatrics

## 2017-08-28 NOTE — Telephone Encounter (Signed)
Fax sent from [facility] requesting prior authorization for Vyvanse 70 mg.  Patient last seen 05/27/17, next appointment 09/02/17.

## 2017-08-28 NOTE — Telephone Encounter (Signed)
PA submitted via Cover My Meds. Key [id number], pending approval

## 2017-09-02 ENCOUNTER — Institutional Professional Consult (permissible substitution): Payer: BLUE CROSS/BLUE SHIELD | Admitting: Pediatrics

## 2017-09-02 ENCOUNTER — Telehealth: Payer: Self-pay | Admitting: Pediatrics

## 2017-09-02 NOTE — Telephone Encounter (Signed)
Mom called and stated that the patient  needs to cancel the appointment today because she starting a new job.Mom knows that there will be a charge.

## 2017-09-02 NOTE — Telephone Encounter (Signed)
Outcome Approvedon February 1 Request Reference Number: [id number]. VYVANSE CAP 70MG  is approved through 08/28/2018. For further questions, call [phone].

## 2017-09-29 DIAGNOSIS — R509 Fever, unspecified: Secondary | ICD-10-CM | POA: Diagnosis not present

## 2017-09-29 DIAGNOSIS — B349 Viral infection, unspecified: Secondary | ICD-10-CM | POA: Diagnosis not present

## 2017-10-01 ENCOUNTER — Ambulatory Visit: Payer: BLUE CROSS/BLUE SHIELD | Admitting: Pediatrics

## 2017-10-01 ENCOUNTER — Encounter: Payer: Self-pay | Admitting: Pediatrics

## 2017-10-01 VITALS — BP 119/89 | HR 103 | Ht 66.5 in | Wt 157.0 lb

## 2017-10-01 DIAGNOSIS — Z79899 Other long term (current) drug therapy: Secondary | ICD-10-CM

## 2017-10-01 DIAGNOSIS — Z7189 Other specified counseling: Secondary | ICD-10-CM

## 2017-10-01 DIAGNOSIS — R278 Other lack of coordination: Secondary | ICD-10-CM | POA: Diagnosis not present

## 2017-10-01 DIAGNOSIS — Z719 Counseling, unspecified: Secondary | ICD-10-CM

## 2017-10-01 DIAGNOSIS — F902 Attention-deficit hyperactivity disorder, combined type: Secondary | ICD-10-CM | POA: Diagnosis not present

## 2017-10-01 DIAGNOSIS — Q999 Chromosomal abnormality, unspecified: Secondary | ICD-10-CM

## 2017-10-01 MED ORDER — LISDEXAMFETAMINE DIMESYLATE 70 MG PO CAPS: 70.0000 mg | ORAL_CAPSULE | Freq: Every morning | ORAL | 0 refills | Status: DC

## 2017-10-01 NOTE — Progress Notes (Signed)
[facility] [place]. [address] [place] [address] Dept: [phone] Dept Fax: [phone] Loc: [phone] Loc Fax: [phone]  Medical Follow-up  Patient ID: Tina Moody, female  DOB: [birth year], 22 y.o.  MRN: [id number]  Date of Evaluation: 10/01/17   PCP: Donald Prose, MD  Accompanied by: self Patient Lives with: mother, father and Rainy 8, Malichai 12 years  HISTORY/CURRENT STATUS:  Chief Complaint - Polite and cooperative and present for medical follow up for medication management of ADHD, dysgraphia and learning differences.  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 Last visit in October 2018 and prescribed Vyvanse 70 mg daily.  Intercurrent illness, URI with cough and out of work for one week since last Thursday.      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  Last semester did not score high enough for credits.  Works at [employer] - 33 hours per week making 9.50 hour, likes the work  MEDICAL HISTORY: Appetite: WNL  Sleep: Bedtime: 2200  Awakens: 0800 Sleep Concerns: Initiation/Maintenance/Other: Asleep easily, sleeps through the night, feels well-rested.  No Sleep concerns. No concerns for toileting. Daily stool, no constipation or diarrhea. Void urine no difficulty. No enuresis.   Participate in daily oral hygiene to include brushing and flossing.  Individual Medical History/Review of System Changes? Yes for PCP for cold with cough medicine that makes her sleepy. Not flu not strep  Allergies: Patient has no known allergies.  Current Medications:  Vyvanse 70 mg Requests no changes. Medication Side Effects: None  Family Medical/Social History Changes?: No  MENTAL  HEALTH: Mental Health Issues:  Denies sadness, loneliness or depression. No self harm or thoughts of self harm or injury. Denies fears, worries and anxieties. Has good peer relations and is not a bully nor is victimized.   Review of Systems  Constitutional: Negative.   HENT: Positive for congestion and rhinorrhea.   Respiratory: Positive for cough.   Cardiovascular: Negative.   Gastrointestinal: Negative.   Endocrine: Negative.   Genitourinary: Negative.   Musculoskeletal: Positive for joint swelling.  Skin: Negative.   Allergic/Immunologic: Negative.   Neurological: Negative for seizures and headaches.  Hematological: Negative.   Psychiatric/Behavioral: Positive for decreased concentration. Negative for behavioral problems, dysphoric mood, self-injury, sleep disturbance and suicidal ideas. The patient is not nervous/anxious and is not hyperactive.   All other systems reviewed and are negative.  Has boyfriend - Merrilee Seashore, met at school still going well.  His Mom "issues", she won't let him hang with her. Driving is going well  PHYSICAL EXAM: Vitals:  Today's Vitals   10/01/17 1000  BP: 119/89  Pulse: (!) 103  Weight: 157 lb (71.2 kg)  Height: 5' 6.5" (1.689 m)  , Facility age limit for growth percentiles is 20 years. Body mass index is 24.96 kg/m.  General Exam: Physical Exam  Constitutional: She is oriented to person, place, and time. Vital signs are normal. She appears well-developed and well-nourished. She is cooperative. No distress.  HENT:  Head: Normocephalic.  Right Ear: Tympanic membrane, external ear and ear canal normal.  Left Ear: Tympanic membrane, external ear and ear canal normal.  Nose: Nose normal.  Mouth/Throat: Uvula is midline, oropharynx is clear and moist and mucous membranes are normal.  Eyes: Conjunctivae, EOM and lids  are normal. Pupils are equal, round, and reactive to light.  Neck: Trachea normal and normal range of motion.  Cardiovascular: Normal  rate, regular rhythm, normal heart sounds and normal pulses.  Pulmonary/Chest: Effort normal and breath sounds normal.  Abdominal: Soft. Normal appearance and bowel sounds are normal.  Genitourinary:  Genitourinary Comments: Deferred  Musculoskeletal: Normal range of motion.  Neurological: She is alert and oriented to person, place, and time. She has normal strength and normal reflexes. She displays no tremor. No cranial nerve deficit or sensory deficit. She displays a negative Romberg sign. She displays no seizure activity. Coordination and gait normal.  Skin: Skin is warm, dry and intact.  Psychiatric: She has a normal mood and affect. Her speech is normal and behavior is normal. Judgment and thought content normal. Her mood appears not anxious. She is not hyperactive. Cognition and memory are normal. She does not express impulsivity. She does not exhibit a depressed mood. She expresses no suicidal ideation. She expresses no suicidal plans. She is attentive.  Vitals reviewed.  Neurological: oriented to time, place, and person  Testing/Developmental Screens: ZOX:WRUE 18/13 Reviewed with patient       DIAGNOSES:    ICD-10-CM   1. ADHD (attention deficit hyperactivity disorder), combined type F90.2   2. Dysgraphia R27.8   3. Medication management Z79.899   4. Patient counseled Z71.9   5. Parenting dynamics counseling Z71.89   6. Counseling and coordination of care Z71.89   7. 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  ADHD medications discussed to include different medications and pharmacologic properties of each. Recommendation for specific medication to include dose, administration, expected effects, possible side effects and the risk to benefit ratio of medication management.  Advised importance of:  Good sleep hygiene (8- 10 hours per night) Limited screen time (none on school nights, no more than 2 hours on weekends) Regular exercise(outside and active play) Healthy eating (drink water, no sodas/sweet tea, limit portions and no seconds).  Counseling at this visit included the review of old records and/or current chart with the patient and family.   Counseling included the following discussion points presented at every visit to improve understanding and treatment compliance.  Recent health history and today's examination Growth and development with anticipatory guidance provided regarding brain growth, executive function maturation and pubertal development School progress and continued advocay for appropriate accommodations to include maintain Structure, routine, organization, reward, motivation and consequences.  Patient verbalized understanding of all topics discussed.   NEXT APPOINTMENT: Return in about 3 months (around 01/01/2018) for Medical Follow up. Medical Decision-making: More than 50% of the appointment was spent counseling and discussing diagnosis and management of symptoms with the patient and family.   BLen Childs NP Counseling Time: 40 Total Contact Time: 50

## 2017-10-01 NOTE — Patient Instructions (Addendum)
DISCUSSION: Patient and family counseled regarding the following coordination of care items:  Continue medication as directed Vyvanse 70 mg daily every morning  Three prescriptions [facility]  [id number] [place], [place] - [address] [facility] [address] [address] [place] [id number] Phone: [phone] Fax: [phone]  Counseled medication administration, effects, and possible side effects.  ADHD medications discussed to include different medications and pharmacologic properties of each. Recommendation for specific medication to include dose, administration, expected effects, possible side effects and the risk to benefit ratio of medication management.  Advised importance of:  Good sleep hygiene (8- 10 hours per night) Limited screen time (none on school nights, no more than 2 hours on weekends) Regular exercise(outside and active play) Healthy eating (drink water, no sodas/sweet tea, limit portions and no seconds).  Counseling at this visit included the review of old records and/or current chart with the patient and family.   Counseling included the following discussion points presented at every visit to improve understanding and treatment compliance.  Recent health history and today's examination Growth and development with anticipatory guidance provided regarding brain growth, executive function maturation and pubertal development School progress and continued advocay for appropriate accommodations to include maintain Structure, routine, organization, reward, motivation and consequences.

## 2017-10-07 DIAGNOSIS — Z0000 Encounter for general adult medical examination without abnormal findings: Secondary | ICD-10-CM | POA: Diagnosis not present

## 2017-11-13 DIAGNOSIS — H5053 Vertical heterophoria: Secondary | ICD-10-CM | POA: Diagnosis not present

## 2017-11-13 DIAGNOSIS — H5034 Intermittent alternating exotropia: Secondary | ICD-10-CM | POA: Diagnosis not present

## 2017-11-17 ENCOUNTER — Other Ambulatory Visit: Payer: Self-pay

## 2017-11-17 MED ORDER — LISDEXAMFETAMINE DIMESYLATE 70 MG PO CAPS: 70.0000 mg | ORAL_CAPSULE | Freq: Every morning | ORAL | 0 refills | Status: DC

## 2017-11-17 NOTE — Telephone Encounter (Signed)
E-Prescribed Vyvanse 70 mg directly to  [id number] [place], [place] - [address] [place] [address] Phone: [phone] Fax: [phone]   

## 2017-11-17 NOTE — Telephone Encounter (Signed)
Patient called in for refill for Vyvanse. Last visit 10/01/2017. [facility]

## 2017-12-12 ENCOUNTER — Other Ambulatory Visit: Payer: Self-pay

## 2017-12-12 MED ORDER — LISDEXAMFETAMINE DIMESYLATE 70 MG PO CAPS: 70.0000 mg | ORAL_CAPSULE | Freq: Every morning | ORAL | 0 refills | Status: DC

## 2017-12-12 NOTE — Telephone Encounter (Signed)
E-Prescribed Vyvanse 70 mg directly to  [id number] - [place], [place] - [address] [place] [address] Phone: [phone] Fax: [phone]   

## 2017-12-12 NOTE — Telephone Encounter (Signed)
Patient called in for refill for Vyvanse. Last visit 10/01/2017. [facility] [address].

## 2017-12-23 ENCOUNTER — Ambulatory Visit: Payer: BLUE CROSS/BLUE SHIELD | Admitting: Pediatrics

## 2017-12-23 ENCOUNTER — Encounter: Payer: Self-pay | Admitting: Pediatrics

## 2017-12-23 VITALS — BP 130/90 | HR 111 | Ht 66.5 in | Wt 148.0 lb

## 2017-12-23 DIAGNOSIS — Z7189 Other specified counseling: Secondary | ICD-10-CM

## 2017-12-23 DIAGNOSIS — Q999 Chromosomal abnormality, unspecified: Secondary | ICD-10-CM | POA: Diagnosis not present

## 2017-12-23 DIAGNOSIS — F902 Attention-deficit hyperactivity disorder, combined type: Secondary | ICD-10-CM | POA: Diagnosis not present

## 2017-12-23 DIAGNOSIS — R278 Other lack of coordination: Secondary | ICD-10-CM | POA: Diagnosis not present

## 2017-12-23 DIAGNOSIS — Z79899 Other long term (current) drug therapy: Secondary | ICD-10-CM | POA: Diagnosis not present

## 2017-12-23 DIAGNOSIS — Z719 Counseling, unspecified: Secondary | ICD-10-CM | POA: Diagnosis not present

## 2017-12-23 MED ORDER — LISDEXAMFETAMINE DIMESYLATE 70 MG PO CAPS: 70.0000 mg | ORAL_CAPSULE | Freq: Every morning | ORAL | 0 refills | Status: DC

## 2017-12-23 NOTE — Patient Instructions (Addendum)
DISCUSSION: Patient counseled regarding the following coordination of care items:  Continue medication as directed Vyvanse 70 mg every morning RX for above e-scribed and sent to pharmacy on record  [id number] [place], [place] - [address] AT [facility] [address] [place] [address] Phone: [phone] Fax: [phone]   Counseled medication administration, effects, and possible side effects.  ADHD medications discussed to include different medications and pharmacologic properties of each. Recommendation for specific medication to include dose, administration, expected effects, possible side effects and the risk to benefit ratio of medication management.  Advised importance of:  Good sleep hygiene (8- 10 hours per night) Limited screen time  Regular exercise Healthy eating (drink water, no sodas/sweet tea, limit portions and no seconds).  Counseling at this visit included the review of old records and/or current chart with the patient   Counseling included the following discussion points presented at every visit to improve understanding and treatment compliance.  Recent health history and today's examination Growth and development with anticipatory guidance provided regarding brain growth, executive function maturation and pubertal development. Avoid alcohol and trying substances.  Transition to adult services in February 2020 due to age out of my practice.  May see FNP here at [facility] or transfer care back to PCP. Patient aware and will discuss with parents.

## 2017-12-23 NOTE — Progress Notes (Signed)
[facility] [address], [place]. [address] [place] [address] Dept: [phone] Dept Fax: [phone] Loc: [phone] Loc Fax: [phone]  Medical Follow-up  Patient ID: Tina Moody, female  DOB: 03-24-1996, 22 y.o.  MRN: [id number]  Date of Evaluation: 12/23/17  PCP: [name], MD  Accompanied by: Self Patient Lives with: mother and father  Tina Moody and Tina Moody - parents, foster kids.  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  Last follow up March 2019 and currently prescribed Vyvanse 70 mg reports daily medication.  Currently only working.  No classes for summer. Working at:  [employer] 12 to 6 pm Monday through Friday, making $950 per hour. Recent weight loss 9 lbs since last visit March 2019, not trying to lose weight, not exercising.   EDUCATION: School: [employer] in the spring 2019 and took: Math Child Development  1 Positive Child Guidance Looked up grades on-line while we were talking - Did not pass any classes does want to go back in August and will take just two. Did take Intro to early childhood education and did pass which she needed for school  MEDICAL HISTORY: Appetite: Reports not often eating breakfast.  Will eat after she wakes up 01-799, so food around 03-999. 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  No Sleep concerns. No concerns for toileting. Daily stool, no constipation or  diarrhea. Void urine no difficulty. No enuresis.   Participate in daily oral hygiene to include brushing and flossing.  Individual Medical History/Review of System Changes? No  Allergies: Patient has no known allergies.  Current Medications:  Vyvanse 70 mg Medication Side Effects: None  Family Medical/Social History Changes?: No  MENTAL HEALTH: Mental Health Issues:  Denies sadness, loneliness or depression. No self harm or thoughts of self harm or injury. Denies fears, worries and anxieties. Has good peer relations and is not a bully nor is victimized.  Review of Systems  Constitutional: Negative.   Cardiovascular: Negative.   Gastrointestinal: Negative.   Endocrine: Negative.   Genitourinary: Negative.   Musculoskeletal: Positive for joint swelling.  Skin: Negative.   Allergic/Immunologic: Negative.   Neurological: Negative for seizures and headaches.  Hematological: Negative.   Psychiatric/Behavioral: Positive for decreased concentration. Negative for behavioral problems, dysphoric mood, self-injury, sleep disturbance and suicidal ideas. The patient is not nervous/anxious and is not hyperactive.   All other systems reviewed and are negative.  PHYSICAL EXAM: Vitals:  Today's Vitals   12/23/17 1048  BP: 130/90  Pulse: (!) 111  Weight: 148 lb (67.1 kg)  Height: 5' 6.5" (1.689 m)  , Facility age limit for growth percentiles is 20 years.  Body mass index is 23.53 kg/m.  General Exam: Physical Exam  Constitutional: She is oriented to person, place, and time. Vital signs are normal. She appears well-developed and well-nourished. She is cooperative. No distress.  HENT:  Head: Normocephalic.  Right Ear: Tympanic membrane, external ear and ear canal normal.  Left Ear: Tympanic membrane, external ear and ear canal normal.  Nose: Nose normal.  Mouth/Throat: Uvula  is midline, oropharynx is clear and moist and mucous membranes are normal.  Eyes: Pupils are equal, round,  and reactive to light. Conjunctivae, EOM and lids are normal.  Neck: Trachea normal and normal range of motion.  Cardiovascular: Normal rate, regular rhythm, normal heart sounds and normal pulses.  Pulmonary/Chest: Effort normal and breath sounds normal.  Abdominal: Soft. Normal appearance and bowel sounds are normal.  Genitourinary:  Genitourinary Comments: Deferred  Musculoskeletal: Normal range of motion.  Neurological: She is alert and oriented to person, place, and time. She has normal strength and normal reflexes. She displays no tremor. No cranial nerve deficit or sensory deficit. She displays a negative Romberg sign. She displays no seizure activity. Coordination and gait normal.  Skin: Skin is warm, dry and intact.  Psychiatric: She has a normal mood and affect. Her speech is normal and behavior is normal. Judgment and thought content normal. Her mood appears not anxious. She is not hyperactive. Cognition and memory are normal. She does not express impulsivity. She does not exhibit a depressed mood. She expresses no suicidal ideation. She expresses no suicidal plans. She is attentive.  Vitals reviewed.  Neurological: oriented to place and person  Testing/Developmental Screens: CGI:18/15  Reviewed with patient and mother    DIAGNOSES:    ICD-10-CM   1. ADHD (attention deficit hyperactivity disorder), combined type F90.2   2. 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 Medication management Z79.899   5. Patient counseled Z71.9   6. Counseling and coordination of care Z71.89     RECOMMENDATIONS:  Patient Instructions  DISCUSSION: Patient counseled regarding the following coordination of care items:  Continue medication as directed Vyvanse 70 mg every morning RX for above e-scribed and sent to pharmacy on record  [id number] [place], [place] - [address] AT [facility] [address] [place] [address] Phone:  [phone] Fax: [phone]   Counseled medication administration, effects, and possible side effects.  ADHD medications discussed to include different medications and pharmacologic properties of each. Recommendation for specific medication to include dose, administration, expected effects, possible side effects and the risk to benefit ratio of medication management.  Advised importance of:  Good sleep hygiene (8- 10 hours per night) Limited screen time  Regular exercise Healthy eating (drink water, no sodas/sweet tea, limit portions and no seconds).  Counseling at this visit included the review of old records and/or current chart with the patient   Counseling included the following discussion points presented at every visit to improve understanding and treatment compliance.  Recent health history and today's examination Growth and development with anticipatory guidance provided regarding brain growth, executive function maturation and pubertal development. Avoid alcohol and trying substances.  Transition to adult services in February 2020 due to age out of my practice.  May see FNP here at [facility] or transfer care back to PCP. Patient aware and will discuss with parents.  Patient verbalized understanding of all topics discussed.  NEXT APPOINTMENT: Return in about 3 months (around 03/25/2018). Medical Decision-making: More than 50% of the appointment was spent counseling and discussing diagnosis and management of symptoms with the patient and family.   Leticia Penna, NP Counseling Time: 40 Total Contact Time: 50

## 2017-12-29 ENCOUNTER — Institutional Professional Consult (permissible substitution): Payer: BLUE CROSS/BLUE SHIELD | Admitting: Pediatrics

## 2018-02-23 ENCOUNTER — Other Ambulatory Visit: Payer: Self-pay

## 2018-02-23 MED ORDER — LISDEXAMFETAMINE DIMESYLATE 70 MG PO CAPS
70.0000 mg | ORAL_CAPSULE | Freq: Every morning | ORAL | 0 refills | Status: DC
Start: 2018-02-23 — End: 2018-03-11

## 2018-02-23 NOTE — Telephone Encounter (Signed)
Mother called in for refill for Vyvanse. Last visit 12/23/2017 next visit 03/11/2018. [facility] [address]

## 2018-02-23 NOTE — Telephone Encounter (Signed)
E-Prescribed Vyvanse 70 mg directly to  [id number] - [place], [place] - [address] AT [address] OF [address] [place] [address] Phone: [phone] Fax: [phone]   

## 2018-02-28 DIAGNOSIS — I10 Essential (primary) hypertension: Secondary | ICD-10-CM | POA: Diagnosis not present

## 2018-02-28 DIAGNOSIS — R509 Fever, unspecified: Secondary | ICD-10-CM | POA: Diagnosis not present

## 2018-02-28 DIAGNOSIS — J029 Acute pharyngitis, unspecified: Secondary | ICD-10-CM | POA: Diagnosis not present

## 2018-03-10 DIAGNOSIS — I10 Essential (primary) hypertension: Secondary | ICD-10-CM | POA: Diagnosis not present

## 2018-03-10 DIAGNOSIS — J069 Acute upper respiratory infection, unspecified: Secondary | ICD-10-CM | POA: Diagnosis not present

## 2018-03-11 ENCOUNTER — Ambulatory Visit (INDEPENDENT_AMBULATORY_CARE_PROVIDER_SITE_OTHER): Payer: BLUE CROSS/BLUE SHIELD | Admitting: Pediatrics

## 2018-03-11 ENCOUNTER — Encounter: Payer: Self-pay | Admitting: Pediatrics

## 2018-03-11 VITALS — BP 112/70 | Ht 66.5 in | Wt 148.0 lb

## 2018-03-11 DIAGNOSIS — F902 Attention-deficit hyperactivity disorder, combined type: Secondary | ICD-10-CM

## 2018-03-11 DIAGNOSIS — Z719 Counseling, unspecified: Secondary | ICD-10-CM

## 2018-03-11 DIAGNOSIS — R278 Other lack of coordination: Secondary | ICD-10-CM | POA: Diagnosis not present

## 2018-03-11 DIAGNOSIS — Z7189 Other specified counseling: Secondary | ICD-10-CM

## 2018-03-11 DIAGNOSIS — Z79899 Other long term (current) drug therapy: Secondary | ICD-10-CM | POA: Diagnosis not present

## 2018-03-11 MED ORDER — VENTOLIN HFA 108 (90 BASE) MCG/ACT IN AERS: 2.0000 | INHALATION_SPRAY | Freq: Four times a day (QID) | RESPIRATORY_TRACT | 0 refills | Status: DC | PRN

## 2018-03-11 MED ORDER — LISDEXAMFETAMINE DIMESYLATE 70 MG PO CAPS: 70.0000 mg | ORAL_CAPSULE | Freq: Every morning | ORAL | 0 refills | Status: DC

## 2018-03-11 NOTE — Patient Instructions (Addendum)
DISCUSSION: Patient and family counseled regarding the following coordination of care items:  Continue medication as directed Vyvanse 70 mg every morning RX for above e-scribed and sent to pharmacy on record  [id number] [place], [place] - [address] [facility] [address] [place] [address] Phone: [phone] Fax: [phone]  Counseled medication administration, effects, and possible side effects.  ADHD medications discussed to include different medications and pharmacologic properties of each. Recommendation for specific medication to include dose, administration, expected effects, possible side effects and the risk to benefit ratio of medication management.  Advised importance of:  Good sleep hygiene (8- 10 hours per night) Limited screen time (none on school nights, no more than 2 hours on weekends) Regular exercise(outside and active play) Healthy eating (drink water, no sodas/sweet tea, limit portions and no seconds).  Counseling at this visit included the review of old records and/or current chart with the patient and family.   Counseling included the following discussion points presented at every visit to improve understanding and treatment compliance.  Recent health history and today's examination Growth and development with anticipatory guidance provided regarding brain growth, executive function maturation and pubertal development School progress and continued advocay for appropriate accommodations to include maintain Structure, routine, organization, reward, motivation and consequences.  Additionally the patient was counseled to take medication while driving.

## 2018-03-11 NOTE — Progress Notes (Signed)
[facility] [address], [address] [place] [address] Dept: [phone] Dept Fax: [phone] Loc: [phone] Loc Fax: [phone]  Medical Follow-up  Patient ID: Tina BladeGenesis R Findlay, female  DOB: [birth year], 22 y.o.  MRN: [id number]  Date of Evaluation: 03/11/18  PCP: [name], Vyvyan, MD  Accompanied by: Self Patient Lives with: mother and father  Connye BurkittRainy and Rise PaganiniMalichai - parents, foster kids.  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  Last follow up July 2019 and currently prescribed Vyvanse 70 mg reports daily medication.  Currently only working.  No classes for summer. Working at:  [employer] 12 to 6 pm Monday through Friday, making $950 per hour. Has intercurrent illness URI has had two urgent care visits (viral and congestion with cough).   EDUCATION: School: [employer] in the fall 2019 and took: Will take early childhood and Eng 111  MEDICAL HISTORY: Appetite: WNL  Sleep: Bedtime: Summer 2100 Awakens: 01-799 variable Sleep Concerns: Initiation/Maintenance/Other: Asleep easily, sleeps through the night, feels well-rested.  No Sleep concerns. No concerns for toileting. Daily stool, no constipation or diarrhea. Void urine no difficulty. No enuresis.   Participate in daily oral hygiene to include brushing and flossing.  Individual Medical History/Review of System Changes? Current URI - cough and poor sleep with it No abx with two urgent care visits.  New meds today with nasal spray and cough suppressant  Allergies: Patient has no known allergies.  Current Medications:  Vyvanse 70 mg Medication Side Effects: None  Family Medical/Social History Changes?: No  MENTAL HEALTH: Mental Health  Issues:  Denies sadness, loneliness or depression. No self harm or thoughts of self harm or injury. Denies fears, worries and anxieties. Has good peer relations and is not a bully nor is victimized.  Review of Systems  Constitutional: Negative.   Cardiovascular: Negative.   Gastrointestinal: Negative.   Endocrine: Negative.   Genitourinary: Negative.   Musculoskeletal: Positive for joint swelling.  Skin: Negative.   Allergic/Immunologic: Negative.   Neurological: Negative for seizures and headaches.  Hematological: Negative.   Psychiatric/Behavioral: Positive for decreased concentration. Negative for behavioral problems, dysphoric mood, self-injury, sleep disturbance and suicidal ideas. The patient is not nervous/anxious and is not hyperactive.   All other systems reviewed and are negative.  PHYSICAL EXAM: Vitals:  Today's Vitals   03/11/18 0903  BP: 112/70  Weight: 148 lb (67.1 kg)  Height: 5' 6.5" (1.689 m)  , Facility age limit for growth percentiles is 20 years.  Body mass index is 23.53 kg/m.  General Exam: Physical Exam  Constitutional: She is oriented to person, place, and time. Vital signs are normal. She appears well-developed and well-nourished. She is cooperative. No distress.  HENT:  Head: Normocephalic.  Right Ear: Tympanic membrane, external ear and ear canal normal.  Left Ear: Tympanic membrane, external ear and ear canal normal.  Nose: Nose normal.  Mouth/Throat: Uvula is midline, oropharynx is clear and moist and mucous membranes are normal.  Eyes: Pupils are equal, round, and reactive to light. Conjunctivae, EOM and lids are normal.  Neck: Trachea normal and normal range of motion.  Cardiovascular: Normal rate, regular rhythm, normal heart sounds and normal pulses.  Pulmonary/Chest: Effort normal and breath sounds normal.  Abdominal: Soft. Normal appearance and bowel sounds are normal.  Genitourinary:  Genitourinary Comments: Deferred  Musculoskeletal:  Normal range of motion.  Neurological: She is alert and oriented to person, place, and time. She has normal strength and normal reflexes. She displays no tremor. No cranial nerve deficit or sensory deficit. She displays a negative Romberg sign. She displays no seizure activity. Coordination and gait normal.  Skin: Skin is warm, dry and intact.  Psychiatric: She has a normal mood and affect. Her speech is normal and behavior is normal. Judgment and thought content normal. Her mood appears not anxious. She is not hyperactive. Cognition and memory are normal. She does not express impulsivity. She does not exhibit a depressed mood. She expresses no suicidal ideation. She expresses no suicidal plans. She is attentive.  Vitals reviewed.  Neurological: oriented to place and person  Testing/Developmental Screens: CGI:19/14  Reviewed with patient and mother      DIAGNOSES:    ICD-10-CM   1. ADHD (attention deficit hyperactivity disorder), combined type F90.2   2. Dysgraphia R27.8   3. Medication management Z79.899   4. Patient counseled Z71.9   5. Counseling and coordination of care Z71.89     RECOMMENDATIONS:  Patient Instructions  DISCUSSION: Patient and family counseled regarding the following coordination of care items:  Continue medication as directed Vyvanse 70 mg every morning RX for above e-scribed and sent to pharmacy on record  [id number] [place], [place] - [address] [facility] [address] [place] [id number] Phone: [phone] Fax: [phone]  Counseled medication administration, effects, and possible side effects.  ADHD medications discussed to include different medications and pharmacologic properties of each. Recommendation for specific medication to include dose, administration, expected effects, possible side effects and the risk to benefit ratio of medication management.  Advised importance of:  Good sleep  hygiene (8- 10 hours per night) Limited screen time (none on school nights, no more than 2 hours on weekends) Regular exercise(outside and active play) Healthy eating (drink water, no sodas/sweet tea, limit portions and no seconds).  Counseling at this visit included the review of old records and/or current chart with the patient and family.   Counseling included the following discussion points presented at every visit to improve understanding and treatment compliance.  Recent health history and today's examination Growth and development with anticipatory guidance provided regarding brain growth, executive function maturation and pubertal development School progress and continued advocay for appropriate accommodations to include maintain Structure, routine, organization, reward, motivation and consequences.  Additionally the patient was counseled to take medication while driving.     Patient verbalized understanding of all topics discussed.  NEXT APPOINTMENT: Return in about 3 months (around 06/11/2018) for Medical Follow up. Medical Decision-making: More than 50% of the appointment was spent counseling and discussing diagnosis and management of symptoms with the patient and family.   Leticia PennaBobi A Chon Buhl, NP Counseling Time: 25 Total Contact Time: 30

## 2018-04-23 ENCOUNTER — Other Ambulatory Visit: Payer: Self-pay

## 2018-04-23 MED ORDER — LISDEXAMFETAMINE DIMESYLATE 70 MG PO CAPS: 70.0000 mg | ORAL_CAPSULE | Freq: Every morning | ORAL | 0 refills | Status: DC

## 2018-04-23 NOTE — Telephone Encounter (Signed)
Patient called in for refill for Vyvanse. Last visit 03/11/2018. [facility] [address]

## 2018-04-23 NOTE — Telephone Encounter (Signed)
RX for above e-scribed and sent to pharmacy on record  [id number] - [place], [place] - [address] [place] [address] Phone: [phone] Fax: [phone]    

## 2018-05-25 ENCOUNTER — Other Ambulatory Visit: Payer: Self-pay

## 2018-05-25 MED ORDER — LISDEXAMFETAMINE DIMESYLATE 70 MG PO CAPS
70.0000 mg | ORAL_CAPSULE | Freq: Every morning | ORAL | 0 refills | Status: DC
Start: 2018-05-25 — End: 2018-06-23

## 2018-05-25 NOTE — Telephone Encounter (Signed)
Patient called in for refill for Vyvanse. Last visit 03/11/2018. [facility] [address]

## 2018-05-25 NOTE — Telephone Encounter (Signed)
appt scheduled

## 2018-06-09 ENCOUNTER — Encounter: Payer: Self-pay | Admitting: Pediatrics

## 2018-06-09 ENCOUNTER — Ambulatory Visit (INDEPENDENT_AMBULATORY_CARE_PROVIDER_SITE_OTHER): Payer: BLUE CROSS/BLUE SHIELD | Admitting: Pediatrics

## 2018-06-09 VITALS — Ht 67.0 in | Wt 157.0 lb

## 2018-06-09 DIAGNOSIS — Q999 Chromosomal abnormality, unspecified: Secondary | ICD-10-CM | POA: Diagnosis not present

## 2018-06-09 DIAGNOSIS — Z79899 Other long term (current) drug therapy: Secondary | ICD-10-CM | POA: Diagnosis not present

## 2018-06-09 DIAGNOSIS — F902 Attention-deficit hyperactivity disorder, combined type: Secondary | ICD-10-CM | POA: Diagnosis not present

## 2018-06-09 DIAGNOSIS — Z719 Counseling, unspecified: Secondary | ICD-10-CM

## 2018-06-09 DIAGNOSIS — R278 Other lack of coordination: Secondary | ICD-10-CM | POA: Diagnosis not present

## 2018-06-09 DIAGNOSIS — Z7189 Other specified counseling: Secondary | ICD-10-CM

## 2018-06-09 NOTE — Patient Instructions (Signed)
DISCUSSION: Patient and family counseled regarding the following coordination of care items:  Continue medication as directed Vyvanse 70 mg every morning  Counseled medication administration, effects, and possible side effects.  ADHD medications discussed to include different medications and pharmacologic properties of each. Recommendation for specific medication to include dose, administration, expected effects, possible side effects and the risk to benefit ratio of medication management.  Advised importance of:  Good sleep hygiene (8- 10 hours per night) Limited screen time (none on school nights, no more than 2 hours on weekends) Regular exercise(outside and active play) Healthy eating (drink water, no sodas/sweet tea, limit portions and no seconds).  Counseling at this visit included the review of old records and/or current chart with the patient and family.   Counseling included the following discussion points presented at every visit to improve understanding and treatment compliance.  Recent health history and today's examination Growth and development with anticipatory guidance provided regarding brain growth, executive function maturation and pubertal development School progress and continued advocay for appropriate accommodations to include maintain Structure, routine, organization, reward, motivation and consequences.  Additionally the patient was counseled to take medication while driving.

## 2018-06-09 NOTE — Progress Notes (Signed)
Patient ID: Tina Moody, female   DOB: Dec 25, 1995, 22 y.o.   MRN: [id number]  Medication Check  Patient ID: Tina Moody  DOB: [id number]-May-1997  MRN: [id number]  DATE:06/09/18 Tina JamesSun, Vyvyan, MD  Accompanied by: Self Patient Lives with: mother, father, sister age 709 and brother age 21 (Tina Moody and Tina Moody)  HISTORY/CURRENT STATUS: Chief Complaint - Polite and cooperative and present for medical follow up for medication management of ADHD, dysgraphia and learning differences. Last follow up August 2019 and currently prescribed Vyvanse 70 mg every morning.  Reports daily compliance and good response.   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  Class schedule M, W 8 to 915 and Eng M, W, F directly after. Works at [employer] - every day except Sat, Tina Moody  MEDICAL HISTORY: Appetite: WNL   Sleep: Bedtime: School nights 2200  Awakens: 0600 daily for work or 0800 for school   Concerns: Initiation/Maintenance/Other: Asleep easily, sleeps through the night, feels well-rested.  No Sleep concerns. No concerns for toileting. Daily stool, no constipation or diarrhea. Void urine no difficulty. No enuresis.   Participate in daily oral hygiene to include brushing and flossing.  Individual Medical History/ Review of Systems: Changes? :No  Family Medical/ Social History: Changes? No  Current Medications:  Vyvnase 70 mg every morning Medication Side Effects: None  MENTAL HEALTH: Mental Health Issues:  Denies sadness, loneliness or depression. No self harm or thoughts of self harm or injury. Denies fears, worries and anxieties. Has good peer relations and is not a bully nor is victimized.  Review of Systems  Constitutional: Negative.   Cardiovascular: Negative.   Gastrointestinal: Negative.   Endocrine: Negative.   Genitourinary: Negative.   Musculoskeletal: Positive for joint swelling.  Skin: Negative.   Allergic/Immunologic:  Negative.   Neurological: Negative for seizures and headaches.  Hematological: Negative.   Psychiatric/Behavioral: Negative for behavioral problems, decreased concentration, dysphoric mood, self-injury, sleep disturbance and suicidal ideas. The patient is not nervous/anxious and is not hyperactive.   All other systems reviewed and are negative.  PHYSICAL EXAM; Vitals:   06/09/18 0800  Weight: 157 lb (71.2 kg)  Height: 5\' 7"  (1.702 m)   Body mass index is 24.59 kg/m.  General Physical Exam: Unchanged from previous exam, date:03/13/2018   Testing/Developmental Screens: CGI/ASRS = 22/15 Reviewed with patient    DIAGNOSES:    ICD-10-CM   1. ADHD (attention deficit hyperactivity disorder), combined type F90.2   2. 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 Medication management Z79.899   5. Patient counseled Z71.9   6. Parenting dynamics counseling Z71.89   7. Counseling and coordination of care Z71.89     RECOMMENDATIONS:  Patient Instructions  DISCUSSION: Patient and family counseled regarding the following coordination of care items:  Continue medication as directed Vyvanse 70 mg every morning  Counseled medication administration, effects, and possible side effects.  ADHD medications discussed to include different medications and pharmacologic properties of each. Recommendation for specific medication to include dose, administration, expected effects, possible side effects and the risk to benefit ratio of medication management.  Advised importance of:  Good sleep hygiene (8- 10 hours per night) Limited screen time (none on school nights, no more than 2 hours on weekends) Regular exercise(outside and active play) Healthy eating (drink water, no sodas/sweet tea, limit portions and no seconds).  Counseling at this visit included the review of old records and/or current chart with  the patient and family.   Counseling included the following discussion points  presented at every visit to improve understanding and treatment compliance.  Recent health history and today's examination Growth and development with anticipatory guidance provided regarding brain growth, executive function maturation and pubertal development School progress and continued advocay for appropriate accommodations to include maintain Structure, routine, organization, reward, motivation and consequences.  Additionally the patient was counseled to take medication while driving.      Patient verbalized understanding of all topics discussed.  NEXT APPOINTMENT:  Return in about 3 months (around 09/09/2018) for Medical Follow up.  Medical Decision-making: More than 50% of the appointment was spent counseling and discussing diagnosis and management of symptoms with the patient and family.  Counseling Time: 25 minutes Total Contact Time: 30 minutes

## 2018-06-23 ENCOUNTER — Other Ambulatory Visit: Payer: Self-pay

## 2018-06-23 MED ORDER — LISDEXAMFETAMINE DIMESYLATE 70 MG PO CAPS: 70.0000 mg | ORAL_CAPSULE | Freq: Every morning | ORAL | 0 refills | Status: DC

## 2018-06-23 NOTE — Telephone Encounter (Signed)
Patient called in for refill for Vyvanse. Last visit11/06/2018 next visit 09/08/2018 [facility] [address]

## 2018-07-27 ENCOUNTER — Other Ambulatory Visit: Payer: Self-pay

## 2018-07-27 MED ORDER — LISDEXAMFETAMINE DIMESYLATE 70 MG PO CAPS: 70.0000 mg | ORAL_CAPSULE | Freq: Every morning | ORAL | 0 refills | Status: DC

## 2018-07-27 NOTE — Telephone Encounter (Signed)
Mom called in for refill for Vyvanse. Last visit11/06/2018 next visit 2/11/2020[facility] [address] 

## 2018-07-27 NOTE — Telephone Encounter (Signed)
RX for above e-scribed and sent to pharmacy on record  [id number] [place], [place] - [address] AT [address]  [place] [address] Phone: [phone] Fax: [phone]    

## 2018-07-30 ENCOUNTER — Telehealth: Payer: Self-pay

## 2018-07-30 NOTE — Telephone Encounter (Signed)
Insurance faxed in Expiring Prior Auth for Vyvanse. Last visit 06/09/2018 next visit 09/08/2018. Submitting Prior Auth to [employer]

## 2018-08-06 DIAGNOSIS — Z118 Encounter for screening for other infectious and parasitic diseases: Secondary | ICD-10-CM | POA: Diagnosis not present

## 2018-08-06 DIAGNOSIS — Z6823 Body mass index (BMI) 23.0-23.9, adult: Secondary | ICD-10-CM | POA: Diagnosis not present

## 2018-08-06 DIAGNOSIS — Z113 Encounter for screening for infections with a predominantly sexual mode of transmission: Secondary | ICD-10-CM | POA: Diagnosis not present

## 2018-08-06 DIAGNOSIS — Z01419 Encounter for gynecological examination (general) (routine) without abnormal findings: Secondary | ICD-10-CM | POA: Diagnosis not present

## 2018-08-26 ENCOUNTER — Other Ambulatory Visit: Payer: Self-pay

## 2018-08-26 MED ORDER — LISDEXAMFETAMINE DIMESYLATE 70 MG PO CAPS: 70.0000 mg | ORAL_CAPSULE | Freq: Every morning | ORAL | 0 refills | Status: DC

## 2018-08-26 NOTE — Telephone Encounter (Signed)
E-Prescribed Vyvanse 70 mg directly to  [id number] [place], [place] - [address] AT [facility] [address] [place] [address] Phone: [phone] Fax: [phone]

## 2018-08-26 NOTE — Telephone Encounter (Signed)
Mom called in for refill for Vyvanse. Last visit11/06/2018 next visit 2/11/2020[facility]

## 2018-09-01 DIAGNOSIS — Z30432 Encounter for removal of intrauterine contraceptive device: Secondary | ICD-10-CM | POA: Diagnosis not present

## 2018-09-01 DIAGNOSIS — Z309 Encounter for contraceptive management, unspecified: Secondary | ICD-10-CM | POA: Diagnosis not present

## 2018-09-03 DIAGNOSIS — H1031 Unspecified acute conjunctivitis, right eye: Secondary | ICD-10-CM | POA: Diagnosis not present

## 2018-09-08 ENCOUNTER — Encounter: Payer: Self-pay | Admitting: Pediatrics

## 2018-09-08 ENCOUNTER — Ambulatory Visit: Payer: BLUE CROSS/BLUE SHIELD | Admitting: Pediatrics

## 2018-09-08 VITALS — BP 149/97 | HR 82 | Ht 67.0 in | Wt 151.0 lb

## 2018-09-08 DIAGNOSIS — R278 Other lack of coordination: Secondary | ICD-10-CM

## 2018-09-08 DIAGNOSIS — Q999 Chromosomal abnormality, unspecified: Secondary | ICD-10-CM

## 2018-09-08 DIAGNOSIS — Z7189 Other specified counseling: Secondary | ICD-10-CM

## 2018-09-08 DIAGNOSIS — Z719 Counseling, unspecified: Secondary | ICD-10-CM

## 2018-09-08 DIAGNOSIS — F902 Attention-deficit hyperactivity disorder, combined type: Secondary | ICD-10-CM

## 2018-09-08 DIAGNOSIS — Z79899 Other long term (current) drug therapy: Secondary | ICD-10-CM

## 2018-09-08 NOTE — Progress Notes (Signed)
Patient ID: Tina Moody, female   DOB: Dec 16, 1995, 23 y.o.   MRN: [id number]  Medication Check  Patient ID: Tina Moody  DOB: [birth year]  MRN: [id number]  DATE:09/08/18 Tina James, MD  Accompanied by: Self Patient Lives with: mother and father  Plus parents have custody of [name] 9 years, and [name] 12 years  HISTORY/CURRENT STATUS: Chief Complaint - Polite and cooperative and present for medical follow up for medication management of ADHD, dysgraphia and learning differences. Last follow up Jun 09, 2018 and currently prescribed Vyvanse 70 mg with good dialy compliance.  EDUCATION: School: [employer]  classes on Monday and Wednesday Child/family, transition math Doing well  Last semester, did not pass classes for early childhood, but was close, did not finish and assignment. Has DSO in college, counseled to use services in person, rather than relying on emailing.Not going in. Just giving up.  [employer] - works variable week schedule M-F depends on [employer].  MEDICAL HISTORY: Appetite: WNL   Sleep: Bedtime: most nights by 2200  Awakens: 0600 or 0700   Concerns: Initiation/Maintenance/Other: Asleep easily, sleeps through the night, feels well-rested.  No Sleep concerns. No concerns for toileting. Daily stool, no constipation or diarrhea. Void urine no difficulty. No enuresis.   Participate in daily oral hygiene to include brushing and flossing.  Individual Medical History/ Review of Systems: Changes? :Yes new OCP, OB/GYN had IUD. Removed in Feb 2020 and now on new pills. Is remember to take daily.  Family Medical/ Social History: Changes? No  Current Medications:  Vyvanse 70 mg every morning Medication Side Effects: None  MENTAL HEALTH: Mental Health Issues:  Denies sadness, loneliness or depression. No self harm or thoughts of self harm or injury. Denies fears, worries and anxieties. Has good peer relations and is not a bully nor is victimized.  Review of Systems   Constitutional: Negative.   Cardiovascular: Negative.   Gastrointestinal: Negative.   Endocrine: Negative.   Genitourinary: Negative.   Musculoskeletal: Positive for joint swelling.  Skin: Negative.   Allergic/Immunologic: Negative.   Neurological: Negative for seizures and headaches.  Hematological: Negative.   Psychiatric/Behavioral: Negative for behavioral problems, decreased concentration, dysphoric mood, self-injury, sleep disturbance and suicidal ideas. The patient is not nervous/anxious and is not hyperactive.   All other systems reviewed and are negative.  PHYSICAL EXAM; Vitals:   09/08/18 0834  BP: (!) 149/97  Pulse: 82  Weight: 151 lb (68.5 kg)  Height: 5\' 7"  (1.702 m)   Body mass index is 23.65 kg/m.  General Physical Exam: Unchanged from previous exam, date:06/09/2018   Testing/Developmental Screens: CGI/ASRS = 19/16 Reviewed with patient   DIAGNOSES:    ICD-10-CM   1. ADHD (attention deficit hyperactivity disorder), combined type F90.2   2. 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 Medication management Z79.899   5. Patient counseled Z71.9   6. Counseling and coordination of care Z71.89     RECOMMENDATIONS:  Patient Instructions  DISCUSSION: Patient counseled regarding the following coordination of care items:  Continue medication as directed Vyvanse 70 mg every morning RX for above e-scribed and sent to pharmacy on record  [id number] [place], [place] - [address] AT [facility] [address] [place] [address] Phone: [phone] Fax: [phone]  Counseled medication administration, effects, and possible side effects.  ADHD medications discussed to include different medications and pharmacologic properties of each. Recommendation for specific medication to include dose, administration, expected effects,  possible side effects and the risk to benefit ratio of medication  management.  Advised importance of:  Good sleep hygiene (8- 10 hours per night) Limited screen time (none on school nights, no more than 2 hours on weekends) Regular exercise(outside and active play) Healthy eating (drink water, no sodas/sweet tea, limit portions and no seconds).  Counseling at this visit included the review of old records and/or current chart with the patient.  Counseling included the following discussion points presented at every visit to improve understanding and treatment compliance.  Recent health history and today's examination Growth and development with anticipatory guidance provided regarding brain growth, executive function maturation and pubertal development School progress and continued advocay for appropriate accommodations to include maintain Structure, routine, organization, reward, motivation and consequences.  Additionally the patient was counseled to take medication while driving.      Patient verbalized understanding of all topics discussed.  NEXT APPOINTMENT:  Return in about 3 months (around 12/07/2018) for Medical Follow up.  Medical Decision-making: More than 50% of the appointment was spent counseling and discussing diagnosis and management of symptoms with the patient and family.  Counseling Time: 25 minutes Total Contact Time: 30 minutes

## 2018-09-08 NOTE — Patient Instructions (Addendum)
DISCUSSION: Patient counseled regarding the following coordination of care items:  Continue medication as directed Vyvanse 70 mg every morning RX for above e-scribed and sent to pharmacy on record  [id number] [place], [place] - [address] AT [facility] [address] [place] [address] Phone: [phone] Fax: [phone]  Counseled medication administration, effects, and possible side effects.  ADHD medications discussed to include different medications and pharmacologic properties of each. Recommendation for specific medication to include dose, administration, expected effects, possible side effects and the risk to benefit ratio of medication management.  Advised importance of:  Good sleep hygiene (8- 10 hours per night) Limited screen time (none on school nights, no more than 2 hours on weekends) Regular exercise(outside and active play) Healthy eating (drink water, no sodas/sweet tea, limit portions and no seconds).  Counseling at this visit included the review of old records and/or current chart with the patient.  Counseling included the following discussion points presented at every visit to improve understanding and treatment compliance.  Recent health history and today's examination Growth and development with anticipatory guidance provided regarding brain growth, executive function maturation and pubertal development School progress and continued advocay for appropriate accommodations to include maintain Structure, routine, organization, reward, motivation and consequences.  Additionally the patient was counseled to take medication while driving.

## 2018-09-25 ENCOUNTER — Telehealth: Payer: Self-pay

## 2018-09-25 ENCOUNTER — Other Ambulatory Visit: Payer: Self-pay

## 2018-09-25 MED ORDER — LISDEXAMFETAMINE DIMESYLATE 70 MG PO CAPS: 70.0000 mg | ORAL_CAPSULE | Freq: Every morning | ORAL | 0 refills | Status: DC

## 2018-09-25 NOTE — Telephone Encounter (Signed)
Patientcalled in for refill for Vyvanse. Last visit2/11/2020next visit 5/18/2020[facility] [address] 

## 2018-09-25 NOTE — Telephone Encounter (Signed)
Pharm faxed in Prior Auth for Vyvanse. Last visit 09/08/2018 next visit 12/14/2018. Submitting Prior Auth to [employer]

## 2018-09-25 NOTE — Telephone Encounter (Signed)
Vyvanse 70 mg daily, # 30 with no Rf's. RX for above e-scribed and sent to pharmacy on record  [id number] [place], [place] - [address] AT [facility] [address] [place] [address] Phone: [phone] Fax: [phone]

## 2018-09-25 NOTE — Telephone Encounter (Signed)
Outcome  Approvedtoday  Request Reference Number: [id number]. VYVANSE CAP 70MG  is approved through 09/26/2019. For further questions, call [phone].

## 2018-10-13 DIAGNOSIS — N644 Mastodynia: Secondary | ICD-10-CM | POA: Diagnosis not present

## 2018-10-16 ENCOUNTER — Other Ambulatory Visit: Payer: Self-pay

## 2018-10-16 MED ORDER — LISDEXAMFETAMINE DIMESYLATE 70 MG PO CAPS: 70.0000 mg | ORAL_CAPSULE | Freq: Every morning | ORAL | 0 refills | Status: DC

## 2018-10-16 NOTE — Telephone Encounter (Signed)
Patientcalled in for refill for Vyvanse. Last visit2/05/2019 next visit 5/18/2020[facility]

## 2018-10-16 NOTE — Telephone Encounter (Signed)
E-Prescribed Vyvanse 70 mg directly to  [id number] [place], [place] - [address] [place] [address] Phone: [phone] Fax: [phone]   

## 2018-11-23 ENCOUNTER — Other Ambulatory Visit: Payer: Self-pay

## 2018-11-23 MED ORDER — LISDEXAMFETAMINE DIMESYLATE 70 MG PO CAPS: 70.0000 mg | ORAL_CAPSULE | Freq: Every morning | ORAL | 0 refills | Status: DC

## 2018-11-23 NOTE — Telephone Encounter (Signed)
E-Prescribed Vyvanse 70 mg directly to  [id number] - [place], [place] - [address] AT [facility] [address]  [place] [address] Phone: [phone] Fax: [phone]

## 2018-11-23 NOTE — Telephone Encounter (Signed)
Patientcalled in for refill for Vyvanse. Last visit2/11/2020next visit 5/18/2020[facility]

## 2018-12-14 ENCOUNTER — Encounter: Payer: Self-pay | Admitting: Family

## 2018-12-14 ENCOUNTER — Other Ambulatory Visit: Payer: Self-pay

## 2018-12-14 ENCOUNTER — Ambulatory Visit (INDEPENDENT_AMBULATORY_CARE_PROVIDER_SITE_OTHER): Payer: BLUE CROSS/BLUE SHIELD | Admitting: Family

## 2018-12-14 DIAGNOSIS — Z79899 Other long term (current) drug therapy: Secondary | ICD-10-CM | POA: Diagnosis not present

## 2018-12-14 DIAGNOSIS — R278 Other lack of coordination: Secondary | ICD-10-CM

## 2018-12-14 DIAGNOSIS — F902 Attention-deficit hyperactivity disorder, combined type: Secondary | ICD-10-CM

## 2018-12-14 DIAGNOSIS — Z719 Counseling, unspecified: Secondary | ICD-10-CM

## 2018-12-14 DIAGNOSIS — Q999 Chromosomal abnormality, unspecified: Secondary | ICD-10-CM

## 2018-12-14 NOTE — Progress Notes (Signed)
[facility] [address], [place]. [address] [place] [address] Dept: [phone] Dept Fax: [phone]  Medication Check visit via Virtual Video due to COVID-19  Patient ID:  Tina Moody  female DOB: Oct 25, 1995   23 y.o.   MRN: [id number]   DATE:12/14/18  PCP: [name], MD  Virtual Visit via Video Note  I connected with  Tina Moody  on 12/14/18 at  8:00 AM EDT by a video enabled telemedicine application and verified that I am speaking with the correct person using two identifiers. Patient. Location: at home   I discussed the limitations, risks, security and privacy concerns of performing an evaluation and management service by telephone and the availability of in person appointments. I also discussed with the parents that there may be a patient responsible charge related to this service. The parents expressed understanding and agreed to proceed.  Provider: Carron Curie, NP  Location: private residence  HISTORY/CURRENT STATUS: Tina Moody is here for medication management of the psychoactive medications for ADHD and review of educational and behavioral concerns.   Tina Moody currently taking Vyvanse 70 mg daily, which is working well. Takes medication at 7-8:00 am. Medication tends to wear off around 5-6:00 pm. Tina Moody is able to focus through homework.   Tina Moody is eating well (eating breakfast, lunch and dinner). Days she eats more and other days will eat limited amount of foods.   Sleeping well (goes to bed at 9:00 pm wakes at 9:00 am the latest), sleeping through the night. No problems with sleeping and some waking to use the bathroom.   EDUCATION: School: [employer]- good this spring session. 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   Activities/ Exercise: intermittently-walking on occasion.   Screen time: (phone, tablet, TV, computer): online with schooling.   MEDICAL HISTORY: Individual Medical History/ Review of Systems: Changes? :None reported recently. Had GYN visit in February. Through work has regular vaccines and health updates routinely.   Family Medical/ Social History: Changes? None reported recently.  Patient Lives with: parents  Current Medications:  Current Outpatient Medications on File Prior to Visit  Medication Sig Dispense Refill  . lisdexamfetamine (VYVANSE) 70 MG capsule Take 1 capsule (70 mg total) by mouth every morning. 30 capsule 0  . TAYTULLA 1-20 MG-MCG(24) CAPS Take 1 tablet by mouth daily.     No current facility-administered medications on file prior to visit.    Medication Side Effects: None  MENTAL HEALTH: Mental Health Issues:   no issues recently.    Tina Moody denies thoughts of hurting self or others, denies depression, anxiety, or fears.   DIAGNOSES:    ICD-10-CM   1. ADHD (attention deficit hyperactivity disorder), combined type F90.2   2. 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 Dysgraphia R27.8   4. Medication management Z79.899   5. Patient counseled Z71.9     RECOMMENDATIONS:  Discussed recent history with patient and updates with health and learning since last f/u visit. Attending school for the summer to take 1 class with support given.    Discussed school academic progress and home school progress using appropriate accommodations as needed for learning support with school.   Referred to ADDitudemag.com for resources  about engaging children who are at home in home and online study.    Discussed continued need for routine, structure, motivation, reward and positive reinforcement with home and school changes due to COVID-19 restrictions.   Encouraged  recommended limitations on TV, tablets, phones, video games and computers for non-educational activities.   Discussed need for bedtime routine, use of good sleep hygiene, no video games, TV or phones for an hour before bedtime.   Encouraged physical activity and outdoor exercise/activity, maintaining social distancing.   Counseled medication pharmacokinetics, options, dosage, administration, desired effects, and possible side effects.   Vyvanse 70 mg daily, no Rx today.    I discussed the assessment and treatment plan with the patient. The patient was provided an opportunity to ask questions and all were answered. The patien agreed with the plan and demonstrated an understanding of the instructions.   I provided 25 minutes of non-face-to-face time during this encounter.   Completed record review for 10 minutes prior to the virtual video visit.   NEXT APPOINTMENT:  Return in about 3 months (around 03/16/2019) for follow up visit.  The patient was advised to call back or seek an in-person evaluation if the symptoms worsen or if the condition fails to improve as anticipated.  Medical Decision-making: More than 50% of the appointment was spent counseling and discussing diagnosis and management of symptoms with the patient and family.  Carron Curieawn M Paretta-Leahey, NP

## 2018-12-22 ENCOUNTER — Other Ambulatory Visit: Payer: Self-pay

## 2018-12-22 MED ORDER — LISDEXAMFETAMINE DIMESYLATE 70 MG PO CAPS: 70.0000 mg | ORAL_CAPSULE | Freq: Every morning | ORAL | 0 refills | Status: DC

## 2018-12-22 NOTE — Telephone Encounter (Signed)
RX for above e-scribed and sent to pharmacy on record  [id number] [place], [place] - [address] [place] [address] Phone: [phone] Fax: [phone]    

## 2018-12-22 NOTE — Telephone Encounter (Signed)
Patient called in for refill for Vyvanse. Last visit 12/14/2018. [facility] [address] 

## 2019-01-18 ENCOUNTER — Other Ambulatory Visit: Payer: Self-pay

## 2019-01-18 MED ORDER — LISDEXAMFETAMINE DIMESYLATE 70 MG PO CAPS: 70.0000 mg | ORAL_CAPSULE | Freq: Every morning | ORAL | 0 refills | Status: DC

## 2019-01-18 NOTE — Telephone Encounter (Signed)
RX for above e-scribed and sent to pharmacy on record  [id number] [place], [place] - [address] [place] [address] Phone: [phone] Fax: [phone]    

## 2019-01-18 NOTE — Telephone Encounter (Signed)
Patient called in for refill for Vyvanse. Last visit 12/14/2018. [facility]

## 2019-02-22 ENCOUNTER — Other Ambulatory Visit: Payer: Self-pay

## 2019-02-22 MED ORDER — LISDEXAMFETAMINE DIMESYLATE 70 MG PO CAPS: 70.0000 mg | ORAL_CAPSULE | Freq: Every morning | ORAL | 0 refills | Status: DC

## 2019-02-22 NOTE — Telephone Encounter (Signed)
E-Prescribed Vyvanse 70 directly to  [id number] [place], [place] - [address] [place]  [address] Phone: [phone] Fax: [phone]   

## 2019-02-22 NOTE — Telephone Encounter (Signed)
Patient called in for refill for Vyvanse. Last visit 12/14/2018 next visit 03/24/2019. [facility]

## 2019-03-24 ENCOUNTER — Ambulatory Visit (INDEPENDENT_AMBULATORY_CARE_PROVIDER_SITE_OTHER): Payer: BC Managed Care – PPO | Admitting: Family

## 2019-03-24 ENCOUNTER — Encounter: Payer: Self-pay | Admitting: Family

## 2019-03-24 DIAGNOSIS — Q999 Chromosomal abnormality, unspecified: Secondary | ICD-10-CM | POA: Diagnosis not present

## 2019-03-24 DIAGNOSIS — Z79899 Other long term (current) drug therapy: Secondary | ICD-10-CM | POA: Diagnosis not present

## 2019-03-24 DIAGNOSIS — F902 Attention-deficit hyperactivity disorder, combined type: Secondary | ICD-10-CM | POA: Diagnosis not present

## 2019-03-24 DIAGNOSIS — R278 Other lack of coordination: Secondary | ICD-10-CM

## 2019-03-24 DIAGNOSIS — F819 Developmental disorder of scholastic skills, unspecified: Secondary | ICD-10-CM

## 2019-03-24 DIAGNOSIS — Z719 Counseling, unspecified: Secondary | ICD-10-CM

## 2019-03-24 MED ORDER — LISDEXAMFETAMINE DIMESYLATE 70 MG PO CAPS: 70.0000 mg | ORAL_CAPSULE | Freq: Every morning | ORAL | 0 refills | Status: DC

## 2019-03-24 NOTE — Progress Notes (Signed)
[facility] [address] [place] [address] Dept: [phone] Dept Fax: [phone]  Medication Check visit via Virtual Video due to COVID-19  Patient ID:  Tina CornGenesis Moody  female DOB: 01-16-1996   23 y.o.   MRN: [id number]   DATE:03/24/19  PCP: [name], Vyvyan, MD  Virtual Visit via Video Note  I connected with  Richarda BladeGenesis Moody Moody on 03/24/19 at  9:00 AM EDT by a video enabled telemedicine application and verified that I am speaking with the correct person using two identifiers. Patient Location: at home.   I discussed the limitations, risks, security and privacy concerns of performing an evaluation and management service by telephone and the availability of in person appointments. I also discussed with the parents that there may be a patient responsible charge related to this service. The parents expressed understanding and agreed to proceed.  Provider: Carron Curieawn M Paretta-Leahey, NP  Location: at work   HISTORY/CURRENT STATUS: Tina Moody is here for medication management of the psychoactive medications for ADHD and review of educational and behavioral concerns.   Tina Moody currently taking  which is working well. Takes medication at 6:00 am. Medication tends to wear off around dinner time. Tina Moody is able to focus through school/homework/work.   Tina Moody is eating well (eating breakfast, lunch and dinner). No issues with eating  Sleeping well (getting enough sleep), sleeping with occasional waking.   EDUCATION: School: [employer] online for all classes this semester Year/Grade: college  Performance/ Grades: average Services: Other: disability services Working: [employer] day[employer], 7:30-4:30 pm daily  Tina Moody is currently in distance learning due to social distancing due to COVID-19 and will continue until at least until the end of the fall semester.   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   MEDICAL HISTORY: Individual Medical History/ Review of Systems: Changes? :Yes, recent head cold.   Family Medical/ Social History: Changes? None reported Patient Lives with: parents  Current Medications:  Current Outpatient Medications on File Prior to Visit  Medication Sig Dispense Refill  . BLISOVI 24 FE 1-20 MG-MCG(24) tablet Take 1 tablet by mouth daily.     No current facility-administered medications on file prior to visit.    Medication Side Effects: None  MENTAL HEALTH: Mental Health Issues:   none reported recently    DIAGNOSES:    ICD-10-CM   1. ADHD (attention deficit hyperactivity disorder), combined type  F90.2   2. Dysgraphia  R27.8   3. 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 Medication management  Z79.899   5. Patient counseled  Z71.9   6. Learning difficulty  F81.9     RECOMMENDATIONS:  Discussed recent history with patient with updates related to health, learning and schooling since last f/u appointment.   Discussed school academic progress and recommended continued appropriate accommodations for the new school year.  Referred to ADDitudemag.com for resources about using distance learning with children with ADHD for college classes.   Children and young adults with ADHD often suffer from disorganization, difficulty with time management, completing projects and other executive function difficulties.   Recommended Reading: "Smart but Scattered" and "Smart but Scattered Teens" by Peg Arita Missawson and Marjo Bickerichard Guare.    Discussed continued need for routine, structure, motivation, reward and positive reinforcement with work, home and schooling environments.   Encouraged recommended limitations on TV, tablets, phones, video games and  computers for non-educational activities.   Discussed need for bedtime routine, use of good sleep hygiene, no video games, TV or  phones for an hour before bedtime.   Encouraged physical activity and outdoor play, maintaining social distancing.   Counseled medication pharmacokinetics, options, dosage, administration, desired effects, and possible side effects.   Vyvanse 70 mg daily, # 30 with no RF's. RX for above e-scribed and sent to pharmacy on record  [id number] [place], [place] [address] [place] [address] Phone: [phone] Fax: [phone]  I discussed the assessment and treatment plan with the patient. The patient was an opportunity to ask questions and all were answered. The patient parent agreed with the plan and demonstrated an understanding of the instructions.   I provided 30 minutes of non-face-to-face time during this encounter.   Completed record review for 10 minutes prior to the virtual video visit.   NEXT APPOINTMENT:  Return in about 3 months (around 06/24/2019) for follow up visit.  The patient was advised to call back or seek an in-person evaluation if the symptoms worsen or if the condition fails to improve as anticipated.  Medical Decision-making: More than 50% of the appointment was spent counseling and discussing diagnosis and management of symptoms with the patient and family.  [name], NP

## 2019-04-26 ENCOUNTER — Other Ambulatory Visit: Payer: Self-pay

## 2019-04-26 MED ORDER — LISDEXAMFETAMINE DIMESYLATE 70 MG PO CAPS: 70.0000 mg | ORAL_CAPSULE | Freq: Every morning | ORAL | 0 refills | Status: DC

## 2019-04-26 NOTE — Telephone Encounter (Signed)
E-Prescribed Vyvanse 70 directly to  [id number] [place], [place] - [address] [place] [address] Phone: [phone] Fax: [phone]   

## 2019-04-26 NOTE — Telephone Encounter (Signed)
Mom called in for refill for Vyvanse. Last visit 8/26/2020next visit 07/21/2019. [facility] [address] 

## 2019-05-20 ENCOUNTER — Other Ambulatory Visit: Payer: Self-pay

## 2019-05-20 MED ORDER — LISDEXAMFETAMINE DIMESYLATE 70 MG PO CAPS: 70.0000 mg | ORAL_CAPSULE | Freq: Every morning | ORAL | 0 refills | Status: DC

## 2019-05-20 NOTE — Telephone Encounter (Signed)
RX for above e-scribed and sent to pharmacy on record  [id number] [place], [place] - [address] [place] [address] Phone: [phone] Fax: [phone]    

## 2019-05-20 NOTE — Telephone Encounter (Signed)
Mom called in for refill for Vyvanse. Last visit 8/26/2020next visit 07/21/2019. [facility]

## 2019-06-22 ENCOUNTER — Other Ambulatory Visit: Payer: Self-pay

## 2019-06-22 MED ORDER — LISDEXAMFETAMINE DIMESYLATE 70 MG PO CAPS: 70.0000 mg | ORAL_CAPSULE | Freq: Every morning | ORAL | 0 refills | Status: DC

## 2019-06-22 NOTE — Telephone Encounter (Signed)
Vyvanse 70 mg daily, # 30 with no RF"s.RX for above e-scribed and sent to pharmacy on record  [id number] [place], [place] - [address] [place] [address] Phone: [phone] Fax: [phone]   

## 2019-06-22 NOTE — Telephone Encounter (Signed)
Mom called in for refill for Vyvanse. Last visit 8/26/2020next visit 07/21/2019. [facility] [address] 

## 2019-07-14 ENCOUNTER — Other Ambulatory Visit: Payer: Self-pay

## 2019-07-14 MED ORDER — LISDEXAMFETAMINE DIMESYLATE 70 MG PO CAPS: 70.0000 mg | ORAL_CAPSULE | Freq: Every morning | ORAL | 0 refills | Status: DC

## 2019-07-14 NOTE — Telephone Encounter (Signed)
Vyvanse 70 mg daily, # 30 with no RF"s.RX for above e-scribed and sent to pharmacy on record  [id number] [place], [place] - [address] [place] [address] Phone: [phone] Fax: [phone]   

## 2019-07-14 NOTE — Telephone Encounter (Signed)
Mom called in for refill for Vyvanse. Last visit 8/26/2020next visit 07/21/2019. [facility] [address] 

## 2019-07-21 ENCOUNTER — Encounter: Payer: Self-pay | Admitting: Family

## 2019-07-21 ENCOUNTER — Ambulatory Visit (INDEPENDENT_AMBULATORY_CARE_PROVIDER_SITE_OTHER): Payer: BC Managed Care – PPO | Admitting: Family

## 2019-07-21 ENCOUNTER — Other Ambulatory Visit: Payer: Self-pay

## 2019-07-21 DIAGNOSIS — F902 Attention-deficit hyperactivity disorder, combined type: Secondary | ICD-10-CM

## 2019-07-21 DIAGNOSIS — R278 Other lack of coordination: Secondary | ICD-10-CM

## 2019-07-21 DIAGNOSIS — Q999 Chromosomal abnormality, unspecified: Secondary | ICD-10-CM

## 2019-07-21 DIAGNOSIS — Z79899 Other long term (current) drug therapy: Secondary | ICD-10-CM

## 2019-07-21 DIAGNOSIS — Z7189 Other specified counseling: Secondary | ICD-10-CM

## 2019-07-21 DIAGNOSIS — Z719 Counseling, unspecified: Secondary | ICD-10-CM

## 2019-07-21 MED ORDER — LISDEXAMFETAMINE DIMESYLATE 70 MG PO CAPS: 70.0000 mg | ORAL_CAPSULE | Freq: Every morning | ORAL | 0 refills | Status: DC

## 2019-07-21 NOTE — Progress Notes (Signed)
[facility] [address], [place] [place] [address] Dept: [phone] Dept Fax: [phone]  Medication Check visit via Virtual Video due to COVID-19  Patient ID:  Tina Moody  female DOB: [birth year]   23 y.o.   MRN: [id number]   DATE:07/21/19  PCP: [name], MD  Virtual Visit via Video Note  I connected with  Tina Moody on 07/21/19 at  9:00 AM EST by a video enabled telemedicine application and verified that I am speaking with the correct person using two identifiers. Patient/Parent Location: at work    I discussed the limitations, risks, security and privacy concerns of performing an evaluation and management service by telephone and the availability of in person appointments. I also discussed with the parents that there may be a patient responsible charge related to this service. The parents expressed understanding and agreed to proceed.  Provider: Carron Curie, NP  Location: private residence  HISTORY/CURRENT STATUS: Tina Moody is here for medication management of the psychoactive medications for ADHD and review of educational and behavioral concerns.   Vandana currently taking Vyvanse, which is working well. Takes medication at 6-7:00 am. Medication tends to wear off around evening. Tishie is able to focus through work.   Lonisha is eating well (eating breakfast, lunch and dinner). Eating with no issues.   Sleeping well (getting enough rest each night), sleeping through the night.   EDUCATION: School: [employer] for all classes, next semester only 1 class Year/Grade: college  Performance/ Grades: average Services: Other: Disability Services Work: [employer] 7:30-5:30 pm  Rynn is currently in distance learning due to social distancing due to COVID-19 and will continue for at least: for the fall semester.   Activities/ Exercise: daily with work requirements.    Screen time: (phone, tablet, TV, computer): computer for learning, TV/movies, phone.   MEDICAL HISTORY: Individual Medical History/ Review of Systems: Changes? :None reported.   Family Medical/ Social History: Changes? No Patient Lives with: mother  Current Medications:  Current Outpatient Medications  Medication Instructions  . BLISOVI 24 FE 1-20 MG-MCG(24) tablet 1 tablet, Oral, Daily  . [START ON 08/14/2019] lisdexamfetamine (VYVANSE) 70 mg, Oral,  Every morning - 10a   Medication Side Effects: None  MENTAL HEALTH: Mental Health Issues:   none reported by patient    DIAGNOSES:    ICD-10-CM   1. 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 Dysgraphia  R27.8   4. Medication management  Z79.899   5. Patient counseled  Z71.9   6. Goals of care, counseling/discussion  Z71.89     RECOMMENDATIONS:  Discussed recent history with patient & parent with updates for progress with school, work, health and medication.   Discussed school academic progress and recommended continued accommodations for the new school semester.  Discussed continued need for structure, routine, reward (external), motivation (internal), positive reinforcement, consequences, and organization with updates for school and work settings.  Encouraged recommended limitations on TV, tablets, phones, video games and computers for non-educational activities.   Discussed need for bedtime routine, use of good sleep hygiene, no video games, TV or phones for an hour before bedtime.   Encouraged physical activity and outdoor play, maintaining social distancing.   Counseled medication pharmacokinetics, options, dosage, administration, desired effects, and possible side effects.   Vyvanse 70 mg daily, # 30 with no RF's post dated for 08/14/2019 RX  for above e-scribed and sent to pharmacy on record  [id number] [place], [place] [address] [place] [address] Phone: [phone] Fax: [phone]  I discussed the assessment and treatment plan with the patient. The patient was provided an opportunity to ask questions and all were answered. The patient agreed with the plan and demonstrated an understanding of the instructions.   I provided 25 minutes of non-face-to-face time during this encounter.   Completed record review for 10 minutes prior to the virtual video visit.   NEXT APPOINTMENT:  Return in about 3 months (around 10/19/2019) for follow up visit.  The patient was advised to call back or seek an in-person evaluation if the symptoms worsen or if the condition fails to improve as anticipated.  Medical Decision-making: More than 50% of the appointment was spent counseling and discussing diagnosis and management of symptoms with the patient and family.  [name], NP

## 2019-08-16 ENCOUNTER — Other Ambulatory Visit: Payer: Self-pay

## 2019-08-16 MED ORDER — LISDEXAMFETAMINE DIMESYLATE 70 MG PO CAPS: 70.0000 mg | ORAL_CAPSULE | Freq: Every morning | ORAL | 0 refills | Status: DC

## 2019-08-16 NOTE — Telephone Encounter (Signed)
Momcalled in for refill for Vyvanse. Last visit12/23/2020. [facility]

## 2019-08-16 NOTE — Telephone Encounter (Signed)
E-Prescribed Vyvanse 70 directly to  [id number] [place], [place] - [address] AT [facility] [address] [place] [address] Phone: [phone] Fax: [phone]

## 2019-08-26 DIAGNOSIS — S60522A Blister (nonthermal) of left hand, initial encounter: Secondary | ICD-10-CM | POA: Diagnosis not present

## 2019-08-26 DIAGNOSIS — R05 Cough: Secondary | ICD-10-CM | POA: Diagnosis not present

## 2019-08-27 DIAGNOSIS — W57XXXA Bitten or stung by nonvenomous insect and other nonvenomous arthropods, initial encounter: Secondary | ICD-10-CM | POA: Diagnosis not present

## 2019-08-27 DIAGNOSIS — S60562A Insect bite (nonvenomous) of left hand, initial encounter: Secondary | ICD-10-CM | POA: Diagnosis not present

## 2019-09-07 ENCOUNTER — Other Ambulatory Visit: Payer: Self-pay | Admitting: Obstetrics

## 2019-09-07 DIAGNOSIS — Z118 Encounter for screening for other infectious and parasitic diseases: Secondary | ICD-10-CM | POA: Diagnosis not present

## 2019-09-07 DIAGNOSIS — N63 Unspecified lump in unspecified breast: Secondary | ICD-10-CM

## 2019-09-07 DIAGNOSIS — Z01419 Encounter for gynecological examination (general) (routine) without abnormal findings: Secondary | ICD-10-CM | POA: Diagnosis not present

## 2019-09-07 DIAGNOSIS — Z124 Encounter for screening for malignant neoplasm of cervix: Secondary | ICD-10-CM | POA: Diagnosis not present

## 2019-09-07 DIAGNOSIS — Z6824 Body mass index (BMI) 24.0-24.9, adult: Secondary | ICD-10-CM | POA: Diagnosis not present

## 2019-09-07 DIAGNOSIS — Z1151 Encounter for screening for human papillomavirus (HPV): Secondary | ICD-10-CM | POA: Diagnosis not present

## 2019-09-16 ENCOUNTER — Other Ambulatory Visit: Payer: Self-pay | Admitting: Family

## 2019-09-16 MED ORDER — LISDEXAMFETAMINE DIMESYLATE 70 MG PO CAPS: 70.0000 mg | ORAL_CAPSULE | Freq: Every morning | ORAL | 0 refills | Status: DC

## 2019-09-16 NOTE — Telephone Encounter (Signed)
E-Prescribed Vyvanse 70 directly to  [id number] - [place], [place] - [address] AT [address] [place] [address] Phone: [phone] Fax: [phone]   

## 2019-09-16 NOTE — Telephone Encounter (Signed)
Mom called in for a refill request for Vyvanse 70 mg [facility] [address] in [place] [address].Patient has appointment on 10/19/2019.

## 2019-09-23 ENCOUNTER — Other Ambulatory Visit: Payer: Self-pay | Admitting: Obstetrics

## 2019-09-23 ENCOUNTER — Ambulatory Visit
Admission: RE | Admit: 2019-09-23 | Discharge: 2019-09-23 | Disposition: A | Discharge status: Home / Self Care | Payer: BC Managed Care – PPO | Source: Ambulatory Visit | Attending: Obstetrics | Admitting: Obstetrics

## 2019-09-23 ENCOUNTER — Other Ambulatory Visit: Payer: Self-pay

## 2019-09-23 DIAGNOSIS — N63 Unspecified lump in unspecified breast: Secondary | ICD-10-CM

## 2019-09-23 DIAGNOSIS — N6489 Other specified disorders of breast: Secondary | ICD-10-CM | POA: Diagnosis not present

## 2019-10-12 DIAGNOSIS — L03116 Cellulitis of left lower limb: Secondary | ICD-10-CM | POA: Diagnosis not present

## 2019-10-19 ENCOUNTER — Encounter: Payer: Self-pay | Admitting: Family

## 2019-10-19 ENCOUNTER — Ambulatory Visit (INDEPENDENT_AMBULATORY_CARE_PROVIDER_SITE_OTHER): Payer: BC Managed Care – PPO | Admitting: Family

## 2019-10-19 DIAGNOSIS — Z719 Counseling, unspecified: Secondary | ICD-10-CM

## 2019-10-19 DIAGNOSIS — R278 Other lack of coordination: Secondary | ICD-10-CM

## 2019-10-19 DIAGNOSIS — F902 Attention-deficit hyperactivity disorder, combined type: Secondary | ICD-10-CM | POA: Diagnosis not present

## 2019-10-19 DIAGNOSIS — F819 Developmental disorder of scholastic skills, unspecified: Secondary | ICD-10-CM

## 2019-10-19 DIAGNOSIS — Q999 Chromosomal abnormality, unspecified: Secondary | ICD-10-CM

## 2019-10-19 DIAGNOSIS — Z79899 Other long term (current) drug therapy: Secondary | ICD-10-CM

## 2019-10-19 MED ORDER — LISDEXAMFETAMINE DIMESYLATE 70 MG PO CAPS: 70.0000 mg | ORAL_CAPSULE | Freq: Every morning | ORAL | 0 refills | Status: DC

## 2019-10-19 NOTE — Progress Notes (Signed)
[facility] [address], [place] [place] [address] Dept: [phone] Dept Fax: [phone]  Medication Check visit via Virtual Video due to COVID-19  Patient ID:  Tina Moody  female DOB: 1995/09/19   24 y.o.   MRN: [id number]   DATE:10/19/19  PCP: [name], MD  Virtual Visit via Video Note  I connected with  Tina Moody on 10/19/19 at  9:00 AM EDT by a video enabled telemedicine application and verified that I am speaking with the correct person using two identifiers. Patient/Parent Location: at home   I discussed the limitations, risks, security and privacy concerns of performing an evaluation and management service by telephone and the availability of in person appointments. I also discussed with the parents that there may be a patient responsible charge related to this service. The parents expressed understanding and agreed to proceed.  Provider: Carron Curie, NP  Location: private location  HISTORY/CURRENT STATUS: Tina Moody is here for medication management of the psychoactive medications for ADHD and review of educational and behavioral concerns.   Tina Moody currently taking Vyvanse 70 mg daily, which is working well. Takes medication at 7-8:00 am. Medication tends to wear off around evening time. Tina Moody is able to focus through school/homework/work.   Tina Moody is eating well (eating breakfast, lunch and dinner). Eating well with no recent changes.  Sleeping well (getting enough sleep), sleeping through the night.   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  Screen time: (phone, tablet, TV, computer): computer for learning, phone, TV and movies.   MEDICAL HISTORY: Individual Medical History/ Review of Systems: Changes? :Yes recent urgent care visit due to left swollen upper thigh with infection in the area. Abx for treatment for 10 days, if continues with need follow up.  Family Medical/ Social History: Changes? None reported Patient Lives with: parents  Current Medications:  Current Outpatient Medications on File Prior to Visit  Medication Sig Dispense Refill  . BLISOVI 24 FE 1-20 MG-MCG(24) tablet Take 1 tablet by mouth daily.    Marland Kitchen sulfamethoxazole-trimethoprim (BACTRIM DS) 800-160 MG tablet Take 1 tablet by mouth 2 (two) times daily.     No current facility-administered medications on file prior to visit.   Medication Side Effects: None  MENTAL HEALTH: Mental Health Issues:   none reported recently.     DIAGNOSES:    ICD-10-CM   1. 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 Dysgraphia  R27.8   4. Learning difficulty  F81.9   5. Medication management  Z79.899   6. Patient counseled  Z71.9     RECOMMENDATIONS:  Discussed recent history with patient with updates for school, academics, work, health and medications.   Discussed school academic progress and recommended continued accommodations needed for learning support.   Discussed health and current weight. Recommended healthy food choices, watching portion sizes, avoiding second helpings, avoiding sugary drinks like soda and tea, drinking more water, getting more exercise.   Discussed continued need for structure, routine, reward (external), motivation (internal), positive reinforcement, consequences,  and organization with school and work settings.   Encouraged recommended limitations on TV, tablets, phones, video games and computers for  non-educational activities.   Discussed need for bedtime routine, use of good sleep hygiene, no video games, TV or phones for an hour before bedtime.   Encouraged physical activity and outdoor exercise, maintaining social distancing.   Counseled medication pharmacokinetics, options, dosage, administration, desired effects, and possible side effects.   Vyvanse 70 mg daily, # 30 with no RF's. RX for above e-scribed and sent to pharmacy on record  [id number] [place], [place] [address] [place] [address] Phone: [phone] Fax: [phone]  I discussed the assessment and treatment plan with the patient. The patient was provided an opportunity to ask questions and all were answered. The patient agreed with the plan and demonstrated an understanding of the instructions.   I provided 25 minutes of non-face-to-face time during this encounter. Completed record review for 10 minutes prior to the virtual video visit.   NEXT APPOINTMENT:  Return in about 3 months (around 01/19/2020) for follow up visit.  The patient was advised to call back or seek an in-person evaluation if the symptoms worsen or if the condition fails to improve as anticipated.  Medical Decision-making: More than 50% of the appointment was spent counseling and discussing diagnosis and management of symptoms with the patient and family.  [name], NP

## 2019-11-16 DIAGNOSIS — H5034 Intermittent alternating exotropia: Secondary | ICD-10-CM | POA: Diagnosis not present

## 2019-11-16 DIAGNOSIS — H52223 Regular astigmatism, bilateral: Secondary | ICD-10-CM | POA: Diagnosis not present

## 2019-11-22 ENCOUNTER — Other Ambulatory Visit: Payer: Self-pay | Admitting: Family

## 2019-11-22 MED ORDER — LISDEXAMFETAMINE DIMESYLATE 70 MG PO CAPS: 70.0000 mg | ORAL_CAPSULE | Freq: Every morning | ORAL | 0 refills | Status: DC

## 2019-11-22 NOTE — Telephone Encounter (Signed)
Mom called for refill for Vyvanse.  Patient last seen 10/19/19.  [facility]

## 2019-11-22 NOTE — Telephone Encounter (Signed)
Vyvanse 70 mg daily, # 30 with no RF"s.RX for above e-scribed and sent to pharmacy on record  [id number] - [place], [place] - [address] [place] [address] Phone: [phone] Fax: [phone]   

## 2019-11-23 ENCOUNTER — Telehealth: Payer: Self-pay | Admitting: Family

## 2019-11-23 MED ORDER — LISDEXAMFETAMINE DIMESYLATE 70 MG PO CAPS: 70.0000 mg | ORAL_CAPSULE | Freq: Every morning | ORAL | 0 refills | Status: DC

## 2019-11-23 NOTE — Telephone Encounter (Signed)
90 day Rx supplied E-Prescribed Vyvanse 70 directly to  [id number] [place], [place] - [address] AT [facility] [address] [place] [address] Phone: [phone] Fax: [phone]

## 2019-11-23 NOTE — Telephone Encounter (Signed)
Fax received stating insurance requires a 90-day supply of Vyvanse.  Original prescription is for a 30-day supply.  Pharmacy not identified on fax, but pharmacy [facility] is [facility].

## 2019-11-25 ENCOUNTER — Ambulatory Visit (HOSPITAL_COMMUNITY)
Admission: EM | Admit: 2019-11-25 | Discharge: 2019-11-25 | Disposition: A | Discharge status: Home / Self Care | Payer: BC Managed Care – PPO | Attending: Urgent Care | Admitting: Urgent Care

## 2019-11-25 ENCOUNTER — Encounter (HOSPITAL_COMMUNITY): Payer: Self-pay

## 2019-11-25 DIAGNOSIS — R238 Other skin changes: Secondary | ICD-10-CM

## 2019-11-25 DIAGNOSIS — M7989 Other specified soft tissue disorders: Secondary | ICD-10-CM

## 2019-11-25 DIAGNOSIS — L089 Local infection of the skin and subcutaneous tissue, unspecified: Secondary | ICD-10-CM | POA: Diagnosis not present

## 2019-11-25 DIAGNOSIS — L139 Bullous disorder, unspecified: Secondary | ICD-10-CM | POA: Diagnosis not present

## 2019-11-25 LAB — CBC
HCT: 42.6 % (ref 36.0–46.0)
Hemoglobin: 13.3 g/dL (ref 12.0–15.0)
MCH: 27.9 pg (ref 26.0–34.0)
MCHC: 31.2 g/dL (ref 30.0–36.0)
MCV: 89.3 fL (ref 80.0–100.0)
Platelets: 279 10*3/uL (ref 150–400)
RBC: 4.77 MIL/uL (ref 3.87–5.11)
RDW: 13.7 % (ref 11.5–15.5)
WBC: 4.4 10*3/uL (ref 4.0–10.5)
nRBC: 0 % (ref 0.0–0.2)

## 2019-11-25 LAB — COMPREHENSIVE METABOLIC PANEL
ALT: 14 U/L (ref 0–44)
AST: 20 U/L (ref 15–41)
Albumin: 3.6 g/dL (ref 3.5–5.0)
Alkaline Phosphatase: 39 U/L (ref 38–126)
Anion gap: 9 (ref 5–15)
BUN: 11 mg/dL (ref 6–20)
CO2: 27 mmol/L (ref 22–32)
Calcium: 9.2 mg/dL (ref 8.9–10.3)
Chloride: 105 mmol/L (ref 98–111)
Creatinine, Ser: 1.1 mg/dL — ABNORMAL HIGH (ref 0.44–1.00)
GFR calc Af Amer: >60 mL/min (ref >60)
GFR calc non Af Amer: >60 mL/min (ref >60)
Glucose, Bld: 95 mg/dL (ref 70–99)
Potassium: 4.1 mmol/L (ref 3.5–5.1)
Sodium: 141 mmol/L (ref 135–145)
Total Bilirubin: 0.7 mg/dL (ref 0.3–1.2)
Total Protein: 6.6 g/dL (ref 6.5–8.1)

## 2019-11-25 LAB — HIV ANTIBODY (ROUTINE TESTING W REFLEX): HIV Screen 4th Generation wRfx: NONREACTIVE

## 2019-11-25 MED ORDER — CEPHALEXIN 500 MG PO CAPS: 500.0000 mg | ORAL_CAPSULE | Freq: Three times a day (TID) | ORAL | 0 refills | Status: DC

## 2019-11-25 NOTE — ED Provider Notes (Signed)
[facility]   MRN: [id number] DOB: 1995-09-09  Subjective:   Tina Moody is a 24 y.o. female presenting for 1 day hx of recurrent boils on her left leg.  Patient states that she has tried Epson salt, covering her wound and using tape for it. 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  Has also had intermittent lower leg swelling bilaterally for few years.  She does have a PCP.  It is unclear to me how much work-up has been done. Of note, patient has had 1 other episode of boils like this over her left hand. Resolved with course of Bactrim back in January of this year.   No current facility-administered medications for this encounter.  Current Outpatient Medications:  .  BLISOVI 24 FE 1-20 MG-MCG(24) tablet, Take 1 tablet by mouth daily., Disp: , Rfl:  .  lisdexamfetamine (VYVANSE) 70 MG capsule, Take 1 capsule (70 mg total) by mouth every morning., Disp: 90 capsule, Rfl: 0 .  sulfamethoxazole-trimethoprim (BACTRIM DS) 800-160 MG tablet, Take 1 tablet by mouth 2 (two) times daily., Disp: , Rfl:    No Known Allergies  Past Medical History:  Diagnosis Date  . Dysgraphia 11/29/2015     History reviewed. No pertinent surgical history.  History reviewed. No pertinent family history.  Social History   Tobacco Use  . Smoking status: Never Smoker  . Smokeless tobacco: Never Used  Substance Use Topics  . Alcohol use: No    Alcohol/week: 0.0 standard drinks  . Drug use: No    ROS   Objective:   Vitals: BP (!) 134/93 (BP Location: Right Arm)   Pulse 93   Temp 98.6 F (37 C) (Oral)   Resp 19   LMP 11/18/2019 (Exact Date)   SpO2 100%   Physical Exam Constitutional:      General: She is not in acute distress.    Appearance: Normal appearance. She is well-developed. She is not ill-appearing, toxic-appearing or diaphoretic.  HENT:     Head: Normocephalic and atraumatic.     Nose: Nose normal.     Mouth/Throat:     Mouth: Mucous membranes  are moist.     Pharynx: Oropharynx is clear.  Eyes:     General: No scleral icterus.    Extraocular Movements: Extraocular movements intact.     Pupils: Pupils are equal, round, and reactive to light.  Cardiovascular:     Rate and Rhythm: Normal rate.  Pulmonary:     Effort: Pulmonary effort is normal.  Musculoskeletal:     Left lower leg: Swelling and tenderness (around the boils) present. No deformity, lacerations or bony tenderness. No edema.       Legs:  Skin:    General: Skin is warm and dry.  Neurological:     General: No focal deficit present.     Mental Status: She is alert and oriented to person, place, and time.  Psychiatric:        Mood and Affect: Mood normal.        Behavior: Behavior normal.        Thought Content: Thought content normal.        Judgment: Judgment normal.         Assessment and Plan :   PDMP not reviewed this encounter.  1. Swelling of lower leg   2. Blisters of multiple sites   3. Superficial skin infection     Start Keflex. Labs pending. Wound care reviewed.  Recheck with PCP.  Encourage patient to sign up for my chart so that I could send her message for review of her labs.  Counseled patient on potential for adverse effects with medications prescribed/recommended today, ER and return-to-clinic precautions discussed, patient verbalized understanding.    Jaynee Eagles, PA-C 11/25/19 1043

## 2019-11-25 NOTE — ED Triage Notes (Signed)
Pt presents to UC with a blister  in her left ankle x 1 day.

## 2019-11-25 NOTE — Discharge Instructions (Signed)
Do not use any nonsteroidal anti-inflammatories (NSAIDs) like ibuprofen, Motrin, naproxen, Aleve, etc. which are all available over-the-counter.  Please just use Tylenol at a dose of 500mg -650mg  once every 6 hours as needed for your aches, pains, fevers.  Please change your dressing 2-3 times daily. Do not apply any ointments or creams. Each time you change your dressing, make sure you clean the wound with gentle soap and warm water. Prior to removing the dressing, soak the leg in warm soapy water to loosen the dressing. Pat your wound dry and let it air out if possible for 1-2 hours before reapplying another dressing. Do not puncture or remove the blisters. Let them resolve on their own and continue to cover them.

## 2019-11-26 DIAGNOSIS — L139 Bullous disorder, unspecified: Secondary | ICD-10-CM | POA: Diagnosis not present

## 2019-12-13 DIAGNOSIS — L139 Bullous disorder, unspecified: Secondary | ICD-10-CM | POA: Diagnosis not present

## 2020-01-03 ENCOUNTER — Other Ambulatory Visit: Payer: Self-pay

## 2020-01-03 ENCOUNTER — Encounter: Payer: Self-pay | Admitting: Family

## 2020-01-03 ENCOUNTER — Ambulatory Visit (INDEPENDENT_AMBULATORY_CARE_PROVIDER_SITE_OTHER): Payer: BC Managed Care – PPO | Admitting: Family

## 2020-01-03 VITALS — BP 112/68 | HR 74 | Resp 16 | Ht 66.75 in | Wt 148.2 lb

## 2020-01-03 DIAGNOSIS — F902 Attention-deficit hyperactivity disorder, combined type: Secondary | ICD-10-CM

## 2020-01-03 DIAGNOSIS — Q999 Chromosomal abnormality, unspecified: Secondary | ICD-10-CM | POA: Diagnosis not present

## 2020-01-03 DIAGNOSIS — Z719 Counseling, unspecified: Secondary | ICD-10-CM

## 2020-01-03 DIAGNOSIS — F819 Developmental disorder of scholastic skills, unspecified: Secondary | ICD-10-CM | POA: Diagnosis not present

## 2020-01-03 DIAGNOSIS — Z7189 Other specified counseling: Secondary | ICD-10-CM

## 2020-01-03 DIAGNOSIS — R278 Other lack of coordination: Secondary | ICD-10-CM | POA: Diagnosis not present

## 2020-01-03 DIAGNOSIS — Z79899 Other long term (current) drug therapy: Secondary | ICD-10-CM

## 2020-01-03 MED ORDER — LISDEXAMFETAMINE DIMESYLATE 70 MG PO CAPS: 70.0000 mg | ORAL_CAPSULE | Freq: Every morning | ORAL | 0 refills | Status: DC

## 2020-01-03 NOTE — Progress Notes (Signed)
[facility] [address] [place] [address] Dept: [phone] Dept Fax: [phone] Loc: [phone] Loc Fax: [phone]  Medication Check  Patient ID: Tina Moody, female  DOB: 1996/06/27, 24 y.o.  MRN: [id number]  Date of Evaluation: 01/03/2020  PCP: [name], MD  Accompanied by: self Patient Lives with: parents  HISTORY/CURRENT STATUS: HPI Patient here by herself for the visit today. Patient interactive and appropriate with provider. 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 Did well last semester with her 1 class and to retake her Math class in the fall semester. No recent health issue and seen eye doctor for routine care. Patient has continued with Vyvanse 70 mg daily with no side effects reported.   EDUCATION/WORK: School: [employer] for fall with 1 class-Math Year/Grade: college  Performance/ Grades: average Services: IEP/504 Plan and Other: help as needed Activities/ Exercise: intermittently  MEDICAL HISTORY: Appetite: Good  MVI/Other: None No changes reported  Sleep: Bedtime: 9-10:00 pm  Awakens: 8:00 am  Concerns: Initiation/Maintenance/Other: no concerns  Individual Medical History/ Review of Systems: Changes? :Yes, seen eye doctor recently. Ankle with 4 areas due to possible contact dermatitis and seen dermatology.   Allergies: Patient has no known allergies.  Current Medications:  Current Outpatient Medications:    BLISOVI 24 FE 1-20 MG-MCG(24) tablet, Take 1 tablet by mouth daily., Disp: , Rfl:    lisdexamfetamine (VYVANSE) 70 MG capsule, Take 1 capsule (70 mg total) by mouth every morning., Disp: 90 capsule, Rfl: 0 Medication Side Effects: None  Family Medical/ Social History: Changes? None reported recently.   MENTAL HEALTH: Mental Health Issues: none reported  PHYSICAL  EXAM; Vitals: Vitals:   01/03/20 0754  BP: 112/68  Pulse: 74  Resp: 16  Height: 5' 6.75" (1.695 m)  Weight: 148 lb 3.2 oz (67.2 kg)  BMI (Calculated): 23.4   General Physical Exam: Unchanged from previous exam, date:none reported Changed:none  Testing/Developmental Screens:  none reported by patient  DIAGNOSES:    ICD-10-CM   1. ADHD (attention deficit hyperactivity disorder), combined type  F90.2   2. Dysgraphia  R27.8   3. 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 Learning difficulty  F81.9   5. Medication management  Z79.899   6. Patient counseled  Z71.9   7. Goals of care, counseling/discussion  Z71.89    RECOMMENDATIONS:  Counseling at this visit included the review of old records and/or current chart with the patient with updates for school, learning, academics, health and learning.   Discussed recent history and today's examination with patient with no changes on examination.   Counseled regarding health and any recent updates for medical interventions.   Recommended a high protein, low sugar diet for ADHD patients, watch portion sizes, avoid second helpings, avoid sugary snacks and drinks, drink more water, eat more fruits and vegetables, increase daily exercise.  Discussed school academic and behavioral progress and advocated for appropriate accommodations needed for college classes.   Discussed importance of maintaining structure, routine, organization, reward, motivation and consequences with consistency with home, work and school.   Counseled medication pharmacokinetics, options, dosage, administration, desired effects, and possible side effects.   Vyvanse 70 mg daily, # 90 with no RF's. RX for above e-scribed and sent to pharmacy on record  [id number] [place], [place] - [address]  [place] [address] Phone: [phone] Fax: [phone]  Advised importance of:  Good sleep hygiene (8-  10 hours per night, no TV or video games for 1 hour before bedtime) Limited screen time (none on school nights, no more than 2 hours/day on weekends, use of screen time for motivation) Regular exercise(outside and active play) Healthy eating (drink water or milk, no sodas/sweet tea, limit portions and no seconds).   NEXT APPOINTMENT: Return in about 3 months (around 04/04/2020) for follow up visit.  Medical Decision-making: More than 50% of the appointment was spent counseling and discussing diagnosis and management of symptoms with the patient and family.  [name], NP Counseling Time: 25 mins  Total Contact Time: 30 mins

## 2020-01-24 DIAGNOSIS — L253 Unspecified contact dermatitis due to other chemical products: Secondary | ICD-10-CM | POA: Diagnosis not present

## 2020-01-24 DIAGNOSIS — L089 Local infection of the skin and subcutaneous tissue, unspecified: Secondary | ICD-10-CM | POA: Diagnosis not present

## 2020-02-18 ENCOUNTER — Other Ambulatory Visit: Payer: Self-pay

## 2020-02-18 MED ORDER — LISDEXAMFETAMINE DIMESYLATE 70 MG PO CAPS: 70.0000 mg | ORAL_CAPSULE | Freq: Every morning | ORAL | 0 refills | Status: DC

## 2020-02-18 NOTE — Telephone Encounter (Signed)
Mom called for refill for Vyvanse. Last visit 01/03/2020 next visit 04/14/2020.[facility]. 

## 2020-02-18 NOTE — Telephone Encounter (Signed)
Vyvanse 70 mg daily, # 30 with no RF"s.RX for above e-scribed and sent to pharmacy on record  [id number] [place], [place] - [address] [place] [address] Phone: [phone] Fax: [phone]   

## 2020-02-21 ENCOUNTER — Telehealth: Payer: Self-pay

## 2020-02-21 NOTE — Telephone Encounter (Signed)
Pharm faxed in Prior Auth for Vyvanse. Last visit 01/03/2020 next visit 04/14/2020. Submitting Prior Auth to [employer]

## 2020-02-21 NOTE — Telephone Encounter (Signed)
Outcome  Approvedtoday  Your PA request has been approved. Additional information will be provided in the approval communication. (Message 1145)

## 2020-03-21 ENCOUNTER — Other Ambulatory Visit: Payer: Self-pay

## 2020-03-21 MED ORDER — LISDEXAMFETAMINE DIMESYLATE 70 MG PO CAPS: 70.0000 mg | ORAL_CAPSULE | Freq: Every morning | ORAL | 0 refills | Status: DC

## 2020-03-21 NOTE — Telephone Encounter (Signed)
Vyvanse 70 mg daily, # 30 with no RF"s.RX for above e-scribed and sent to pharmacy on record  [id number] [place], [place] - [address] [place] [address] Phone: [phone] Fax: [phone]   

## 2020-03-21 NOTE — Telephone Encounter (Signed)
Mom called for refill for Vyvanse. Last visit 01/03/2020 next visit 04/14/2020.Please [facility]scribe [facility].

## 2020-04-14 ENCOUNTER — Encounter: Payer: Self-pay | Admitting: Family

## 2020-04-14 ENCOUNTER — Other Ambulatory Visit: Payer: Self-pay

## 2020-04-18 ENCOUNTER — Encounter: Payer: Self-pay | Admitting: Family

## 2020-04-18 ENCOUNTER — Other Ambulatory Visit: Payer: Self-pay

## 2020-04-18 ENCOUNTER — Telehealth (INDEPENDENT_AMBULATORY_CARE_PROVIDER_SITE_OTHER): Payer: BC Managed Care – PPO | Admitting: Family

## 2020-04-18 DIAGNOSIS — R278 Other lack of coordination: Secondary | ICD-10-CM

## 2020-04-18 DIAGNOSIS — Z719 Counseling, unspecified: Secondary | ICD-10-CM

## 2020-04-18 DIAGNOSIS — F902 Attention-deficit hyperactivity disorder, combined type: Secondary | ICD-10-CM

## 2020-04-18 DIAGNOSIS — Z79899 Other long term (current) drug therapy: Secondary | ICD-10-CM

## 2020-04-18 DIAGNOSIS — Z7189 Other specified counseling: Secondary | ICD-10-CM

## 2020-04-18 DIAGNOSIS — F819 Developmental disorder of scholastic skills, unspecified: Secondary | ICD-10-CM

## 2020-04-18 DIAGNOSIS — Q999 Chromosomal abnormality, unspecified: Secondary | ICD-10-CM

## 2020-04-18 MED ORDER — LISDEXAMFETAMINE DIMESYLATE 70 MG PO CAPS: 70.0000 mg | ORAL_CAPSULE | Freq: Every morning | ORAL | 0 refills | Status: DC

## 2020-04-18 NOTE — Progress Notes (Signed)
[place] [facility] [address] [place]. [address] [place] [address] Dept: [phone] Dept Fax: [phone]  Medication Check visit via Virtual Video due to COVID-19  Patient ID:  Tina Moody  female DOB: 06-Aug-1995   24 y.o.   MRN: [id number]   DATE:04/18/20  PCP: [name], MD  Virtual Visit via Video Note  I connected with  Tina Moody on 04/18/20 at  9:00 AM EDT by a video enabled telemedicine application and verified that I am speaking with the correct person using two identifiers. Patient/Parent Location: at work   I discussed the limitations, risks, security and privacy concerns of performing an evaluation and management service by telephone and the availability of in person appointments. I also discussed with the parents that there may be a patient responsible charge related to this service. The parents expressed understanding and agreed to proceed.  Provider: Carron Curie, NP  Location: at work  HISTORY/CURRENT STATUS: Tina Moody is here for medication management of the psychoactive medications for ADHD and review of educational and behavioral concerns.   Tina Moody currently taking Vyvanse daily, which is working well. Takes medication at 6-7:00 am. Medication tends to wear off around late afternoon. Tina Moody is able to focus through work/homework.   Tina Moody is eating well (eating breakfast, lunch and dinner). Eating well with no issues.   Sleeping well (goes to bed at 9-10:00 pm wakes at 6-7:00 am), sleeping through the night.   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  Screen time: (phone, tablet, TV, computer): computer for class work and Producer, television/film/video,  phone, TV and movies.   MEDICAL HISTORY: Individual Medical History/ Review of Systems: Changes? :None reported recently, seen GYN this year for [facility] consult with continuation at the same dose.   Family Medical/ Social History: Changes? None reported Patient Lives with: parents  Current Medications:  Current Outpatient Medications  Medication Instructions  . BLISOVI 24 FE 1-20 MG-MCG(24) tablet 1 tablet, Oral, Daily  . lisdexamfetamine (VYVANSE) 70 mg, Oral,  Every morning - 10a   Medication Side Effects: None  MENTAL HEALTH: Mental Health Issues:   None reported    DIAGNOSES:    ICD-10-CM   1. 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 Dysgraphia  R27.8   4. Learning difficulty  F81.9   5. Medication management  Z79.899   6. Patient counseled  Z71.9   7. Goals of care, counseling/discussion  Z71.89     RECOMMENDATIONS:  Discussed recent history with patient with updates for school, work, academics, health and medications.   Discussed school academic progress and recommended continued accommodations as needed on campus for learning support.  Discussed growth and development and current weight. Recommended healthy food choices, watching portion sizes, avoiding second helpings, avoiding sugary drinks like soda and tea, drinking more water, getting more exercise.   Discussed continued need for structure, routine, reward (external), motivation (internal), positive reinforcement, consequences, and organization with school, home and work.   Encouraged recommended limitations on TV, tablets, phones, video games and computers for non-educational activities.   Discussed need for bedtime routine, use of good sleep hygiene, no video games, TV or phones for an hour before bedtime.  Encouraged physical activity and outdoor play, maintaining social distancing.   Counseled medication pharmacokinetics, options, dosage,  administration, desired effects, and possible side effects.   Vyvanse 70 mg daily, # 30 with no RF's.RX for above e-scribed and sent to pharmacy on record  [id number] [place], [place] - [address] AT [facility] [address] [place] [address] Phone: [phone] Fax: [phone]  I discussed the assessment and treatment plan with the patient. The patient was provided an opportunity to ask questions and all were answered. The patient agreed with the plan and demonstrated an understanding of the instructions.   I provided 25 minutes of non-face-to-face time during this encounter.   Completed record review for 10 minutes prior to the virtual video visit.   NEXT APPOINTMENT:  Return in about 3 months (around 07/18/2020) for f/u visit.  The patient was advised to call back or seek an in-person evaluation if the symptoms worsen or if the condition fails to improve as anticipated.  Medical Decision-making: More than 50% of the appointment was spent counseling and discussing diagnosis and management of symptoms with the patient and family.  Carron Curie, NP

## 2020-04-19 ENCOUNTER — Encounter: Payer: Self-pay | Admitting: Family

## 2020-04-19 NOTE — Progress Notes (Signed)
This encounter was created in error - please disregard.

## 2020-05-16 ENCOUNTER — Other Ambulatory Visit: Payer: Self-pay

## 2020-05-16 MED ORDER — LISDEXAMFETAMINE DIMESYLATE 70 MG PO CAPS: 70.0000 mg | ORAL_CAPSULE | Freq: Every morning | ORAL | 0 refills | Status: DC

## 2020-05-16 NOTE — Telephone Encounter (Signed)
Mom called for refill for Vyvanse.Last visit 04/18/2020 next visit 07/18/2020.Please e[facility]scribe to [facility].

## 2020-05-16 NOTE — Telephone Encounter (Signed)
RX for above e-scribed and sent to pharmacy on record  [id number] - [place], [place] - [address] [place] [address] Phone: [phone] Fax: [phone]    

## 2020-07-18 ENCOUNTER — Encounter: Payer: Self-pay | Admitting: Family

## 2020-07-18 ENCOUNTER — Ambulatory Visit: Payer: BC Managed Care – PPO | Admitting: Family

## 2020-07-18 ENCOUNTER — Other Ambulatory Visit: Payer: Self-pay

## 2020-07-18 VITALS — BP 118/76 | HR 76 | Resp 16 | Ht 67.75 in | Wt 151.0 lb

## 2020-07-18 DIAGNOSIS — F819 Developmental disorder of scholastic skills, unspecified: Secondary | ICD-10-CM | POA: Diagnosis not present

## 2020-07-18 DIAGNOSIS — Q999 Chromosomal abnormality, unspecified: Secondary | ICD-10-CM

## 2020-07-18 DIAGNOSIS — R278 Other lack of coordination: Secondary | ICD-10-CM

## 2020-07-18 DIAGNOSIS — Z7189 Other specified counseling: Secondary | ICD-10-CM

## 2020-07-18 DIAGNOSIS — F902 Attention-deficit hyperactivity disorder, combined type: Secondary | ICD-10-CM

## 2020-07-18 DIAGNOSIS — Z79899 Other long term (current) drug therapy: Secondary | ICD-10-CM

## 2020-07-18 DIAGNOSIS — Z719 Counseling, unspecified: Secondary | ICD-10-CM

## 2020-07-18 MED ORDER — LISDEXAMFETAMINE DIMESYLATE 70 MG PO CAPS: 70.0000 mg | ORAL_CAPSULE | Freq: Every morning | ORAL | 0 refills | Status: DC

## 2020-07-18 NOTE — Progress Notes (Addendum)
[place] [facility] [facility] [place] [facility] [address] [place] [address] Dept: [phone] Dept Fax: [phone] Loc: [phone] Loc Fax: [phone]  Medication Check  Patient ID: Tina Moody, female  DOB: 1996/07/29, 24 y.o.  MRN: [id number]  Date of Evaluation: 07/18/2020 PCP: [name], MD  Accompanied by: Self Patient Lives with: parents  HISTORY/CURRENT STATUS: HPI Patient here by herself for the visit today. Patient interactive and appropriate with provider. Patient having difficulty with math class and having to retake class this coming semester, but it will be in person. Working full-time still at [employer] with recent change in hours. Patient has continued with Vyvanse with no side effects reported.   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 :Yes, recent URI and now still having some head stuffiness.   Allergies: Patient has no known allergies.  Current Medications:  Current Outpatient Medications:  .  Norethindrone Acetate-Ethinyl Estrad-FE (HAILEY 24 FE) 1-20 MG-MCG(24) tablet, , Disp: , Rfl:  .  lisdexamfetamine (VYVANSE) 70 MG capsule, Take 1 capsule (70 mg total) by mouth every morning., Disp: 30 capsule, Rfl: 0 Medication Side Effects: None  Family Medical/ Social History: Changes? None   MENTAL HEALTH: Mental Health Issues: none reported  PHYSICAL EXAM; Vitals:  Vitals:   07/18/20 0806  BP: 118/76  Pulse: 76  Resp: 16  Height: 5' 7.75" (1.721 m)  Weight:  151 lb (68.5 kg)  BMI (Calculated): 23.13    General Physical Exam: Unchanged from previous exam, date: last f/u visit in September Changed:None  DIAGNOSES:    ICD-10-CM   1. 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 Dysgraphia  R27.8   4. Learning difficulty  F81.9   5. Patient counseled  Z71.9   6. Medication management  Z79.899   7. Goals of care, counseling/discussion  Z71.89     RECOMMENDATIONS: Counseling at this visit included the review of old records and/or current chart with the patient with updates for health, work, college classes, and medication management.   Discussed recent history and today's examination with patient with no changes on exam today.   Counseled regarding health and updates since last visit with appointment.  Recommended a high protein, low sugar diet, watch portion sizes, avoid second helpings, avoid sugary snacks and drinks, drink more water, eat more fruits and vegetables, increase daily exercise.  Discussed school academic and behavioral progress and advocated for appropriate accommodations needed for learning support.   Discussed importance of maintaining structure, routine, organization, reward, motivation and consequences with consistency with school, home and work settings.   Counseled medication pharmacokinetics, options, dosage, administration, desired effects, and possible side effects.   Vyvanse 70 mg daily, # 30 with no RF's.RX for above e-scribed and sent to pharmacy on record  [id number] [place], [place] - [address] AT [facility] [address] [place] [address] Phone: [phone] Fax: [phone]  Advised importance of:  Good sleep hygiene (8- 10 hours per night, no TV or video games for 1 hour before bedtime)  Limited screen time (none on school nights, no more than 2 hours/day on weekends, use of screen time  for motivation) Regular exercise(outside and active play) Healthy eating (drink water or milk, no sodas/sweet tea, limit portions and no seconds).   NEXT APPOINTMENT: Return in about 3 months (around 10/16/2020) for F/U VISIT.  Medical Decision-making: More than 50% of the appointment was spent counseling and discussing diagnosis and management of symptoms with the patient and family.  Carron Curie, NP Counseling Time: 25 mins Total Contact Time: 30 mins

## 2020-08-17 ENCOUNTER — Other Ambulatory Visit: Payer: Self-pay

## 2020-08-17 MED ORDER — LISDEXAMFETAMINE DIMESYLATE 70 MG PO CAPS: 70.0000 mg | ORAL_CAPSULE | Freq: Every morning | ORAL | 0 refills | Status: DC

## 2020-08-17 NOTE — Telephone Encounter (Signed)
Last visit 07/18/2020 next visit 10/23/2020

## 2020-08-17 NOTE — Telephone Encounter (Signed)
E-Prescribed Vyvanse 70 mg 90 days supply directly to  [id number] - [place], [place] - [address] AT [facility] [address] [place] [address] Phone: [phone] Fax: [phone]

## 2020-10-23 ENCOUNTER — Encounter: Payer: Self-pay | Admitting: Family

## 2020-10-23 ENCOUNTER — Other Ambulatory Visit: Payer: Self-pay

## 2020-10-23 NOTE — Progress Notes (Signed)
This encounter was created in error - please disregard.

## 2020-10-27 ENCOUNTER — Telehealth (INDEPENDENT_AMBULATORY_CARE_PROVIDER_SITE_OTHER): Payer: BC Managed Care – PPO | Admitting: Family

## 2020-10-27 ENCOUNTER — Other Ambulatory Visit: Payer: Self-pay

## 2020-10-27 ENCOUNTER — Encounter: Payer: Self-pay | Admitting: Family

## 2020-10-27 DIAGNOSIS — Z7189 Other specified counseling: Secondary | ICD-10-CM

## 2020-10-27 DIAGNOSIS — Q999 Chromosomal abnormality, unspecified: Secondary | ICD-10-CM

## 2020-10-27 DIAGNOSIS — F902 Attention-deficit hyperactivity disorder, combined type: Secondary | ICD-10-CM

## 2020-10-27 DIAGNOSIS — F819 Developmental disorder of scholastic skills, unspecified: Secondary | ICD-10-CM | POA: Diagnosis not present

## 2020-10-27 DIAGNOSIS — Z79899 Other long term (current) drug therapy: Secondary | ICD-10-CM

## 2020-10-27 DIAGNOSIS — R278 Other lack of coordination: Secondary | ICD-10-CM

## 2020-10-27 DIAGNOSIS — Z719 Counseling, unspecified: Secondary | ICD-10-CM

## 2020-10-27 NOTE — Progress Notes (Signed)
[place] [facility] [address], [place]. [address] [place] [address] Dept: [phone] Dept Fax: [phone]  Medication Check visit via Virtual Video   Patient ID:  Tina Moody  female DOB: 1996-05-05   25 y.o.   MRN: [id number]   DATE:10/27/20  PCP: [name], MD  Virtual Visit via Video Note  I connected with  Tina Moody on 10/27/20 at  8:30 AM EDT by a video enabled telemedicine application and verified that I am speaking with the correct person using two identifiers. Patient/Parent Location: at school   I discussed the limitations, risks, security and privacy concerns of performing an evaluation and management service by telephone and the availability of in person appointments. I also discussed with the parents that there may be a patient responsible charge related to this service. The parents expressed understanding and agreed to proceed.  Provider: Carron Curie, NP  Location: private work location  HPI/CURRENT STATUS: Tina Moody is here for medication management of the psychoactive medications for ADHD and review of educational and behavioral concerns.   Tina Moody currently taking Vyvnase, which is working well. Takes medication at 7:00 am. Medication tends to wear off around evening. Tina Moody is able to focus through school/homework.   Tina Moody is eating well (eating breakfast, lunch and dinner). Eating with no reported changes.   Sleeping well (goes to bed at 10-11:00 pm wakes at 6-7:00 am), sleeping through the night. No changes, but sometimes may be later depending on work or school.   EDUCATION: School: [employer] taking 1 class-Math Year/Grade: college  Performance/ Grades: failing-3rd time taking the medication Services: Other: Disability   Activities/ Exercise: daily  Screen time: (phone, tablet, TV, computer): computer for learning needs, TV, phone and movies.   MEDICAL  HISTORY: Individual Medical History/ Review of Systems: None reported recently.   Family Medical/ Social History: Changes? No Patient Lives with: parents  MENTAL HEALTH: Mental Health Issues:   none    Allergies: No Known Allergies  Current Medications:  Current Outpatient Medications  Medication Instructions  . lisdexamfetamine (VYVANSE) 70 mg, Oral, Every morning, 90 days supply. Supervisor: Tina Rout, MD, DEA:  [id number], NPI:  [id number]  . Norethindrone Acetate-Ethinyl Estrad-FE (HAILEY 24 FE) 1-20 MG-MCG(24) tablet No dose, route, or frequency recorded.   Medication Side Effects: None  DIAGNOSES:    ICD-10-CM   1. 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 Dysgraphia  R27.8   4. Learning difficulty  F81.9   5. Medication management  Z79.899   6. Patient counseled  Z71.9   7. Goals of care, counseling/discussion  Z71.89    ASSESSMENT: Patient doing well at work with no recent issues and working full-time. No assistance needed for work duties. Has continued with classes at [employer] and taking Math class this semester. Not passing her class again this semester being in person, but has the same instructor.Disability services are in place at college to assist with learning, dysgraphia, and attention needs for ongoing academic support. May consider taking a different course of classes for the next 3 semesters with early childhood for certification. Then can revisit the Math class after obtaining her certification. No current difficulties with health, eating, or sleeping. No current need for counseling to support her occasional anxiety. Has continued with Vyvanse 70 mg daily with good efficacy for day and evening time with academics and work duties. No side effects  and no need for change in dose today.   PLAN/RECOMMENDATIONS:  Patient provided updates with work, school, academic classes, health and  medications.  Patient getting disability services on campus at school. Not passing her math class again this semester, but unable to attend learning center due to hours and work schedule conflict.  Reviewed plans for next semester with class. Discussed options for retaking the Math class for summer session or fall semester. To consider taking the early child development classes for the next 3 semesters to receive her certification. May consider revisiting Math class to retake after a break and once obtains her certification.   Reviewed daily schedule and structure for school and work. Rovena has maintained a routine to keep track of hours for class times, work schedule and homework time with continued success.   Reviewed sleep schedule and sleep hygiene, healthy eating habits, and regular activity for health promotion.    Counseled medication pharmacokinetics, options, dosage, administration, desired effects, and possible side effects.   Vyvanse 70 mg daily, no Rx today  I discussed the assessment and treatment plan with the patient. The patient was provided an opportunity to ask questions and all were answered. The patient agreed with the plan and demonstrated an understanding of the instructions.   I provided 24 minutes of non-face-to-face time during this encounter. Completed record review for 10 minutes prior to the virtual video visit.   NEXT APPOINTMENT:  02/07/2021  Return in about 3 months (around 01/26/2021) for f/u Visit.  The patient was advised to call back or seek an in-person evaluation if the symptoms worsen or if the condition fails to improve as anticipated.   Carron Curie, NP

## 2020-11-05 DIAGNOSIS — R059 Cough, unspecified: Secondary | ICD-10-CM | POA: Diagnosis not present

## 2020-11-05 DIAGNOSIS — J069 Acute upper respiratory infection, unspecified: Secondary | ICD-10-CM | POA: Diagnosis not present

## 2020-11-05 DIAGNOSIS — J029 Acute pharyngitis, unspecified: Secondary | ICD-10-CM | POA: Diagnosis not present

## 2020-11-14 ENCOUNTER — Other Ambulatory Visit: Payer: Self-pay

## 2020-11-14 NOTE — Telephone Encounter (Signed)
Last visit 10/27/2020 next visit 02/07/2021

## 2020-11-15 MED ORDER — LISDEXAMFETAMINE DIMESYLATE 70 MG PO CAPS: 70.0000 mg | ORAL_CAPSULE | Freq: Every morning | ORAL | 0 refills | Status: DC

## 2020-11-15 NOTE — Telephone Encounter (Signed)
Vyvanse 70 mg daily, # 30 with no RF's.RX for above e-scribed and sent to pharmacy on record  [id number] [place], [place] - [address] AT [facility] [address] [place] [address] Phone: [phone] Fax: [phone]

## 2020-11-24 DIAGNOSIS — Z6824 Body mass index (BMI) 24.0-24.9, adult: Secondary | ICD-10-CM | POA: Diagnosis not present

## 2020-11-24 DIAGNOSIS — Z01419 Encounter for gynecological examination (general) (routine) without abnormal findings: Secondary | ICD-10-CM | POA: Diagnosis not present

## 2020-12-03 IMAGING — US US AXILLARY RIGHT
1 series · 5 of 5 positions shown · non-contrast
Comparison: None

CLINICAL DATA: 24-year-old female with intermittent RIGHT axillary
fullness, which has decreased significantly recently.

EXAM:
ULTRASOUND OF THE RIGHT BREAST

[Series 1: us axillary right · 0.06mm/px · 5 of 5 slices shown]
[im 1/5]
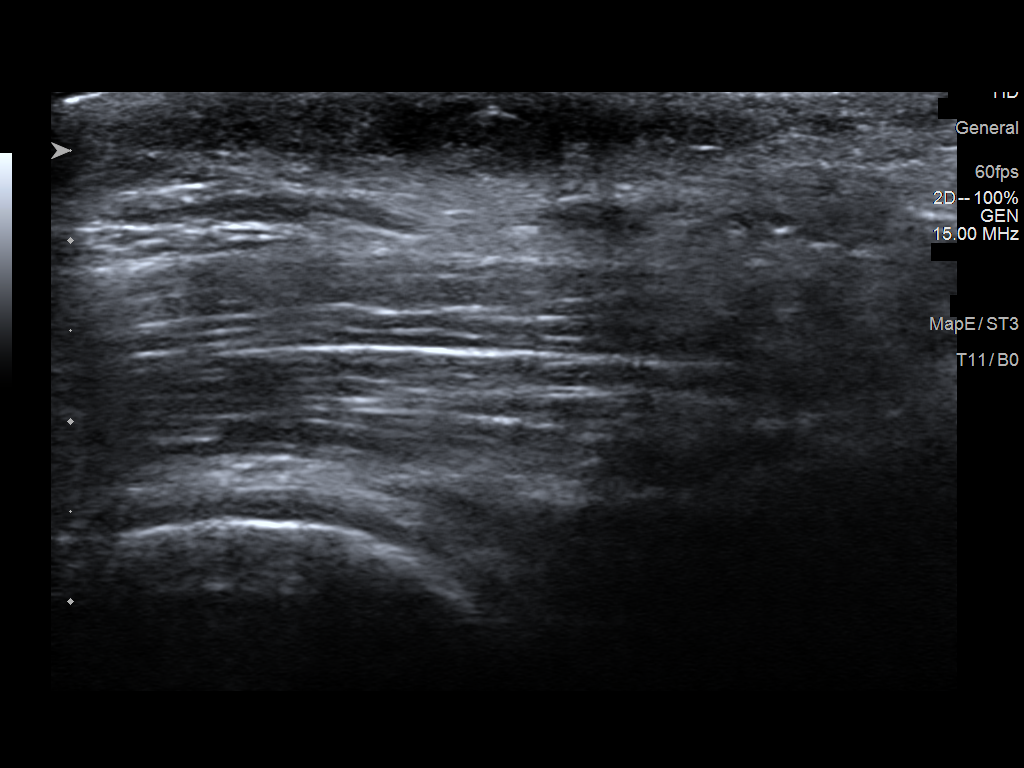
[im 2/5]
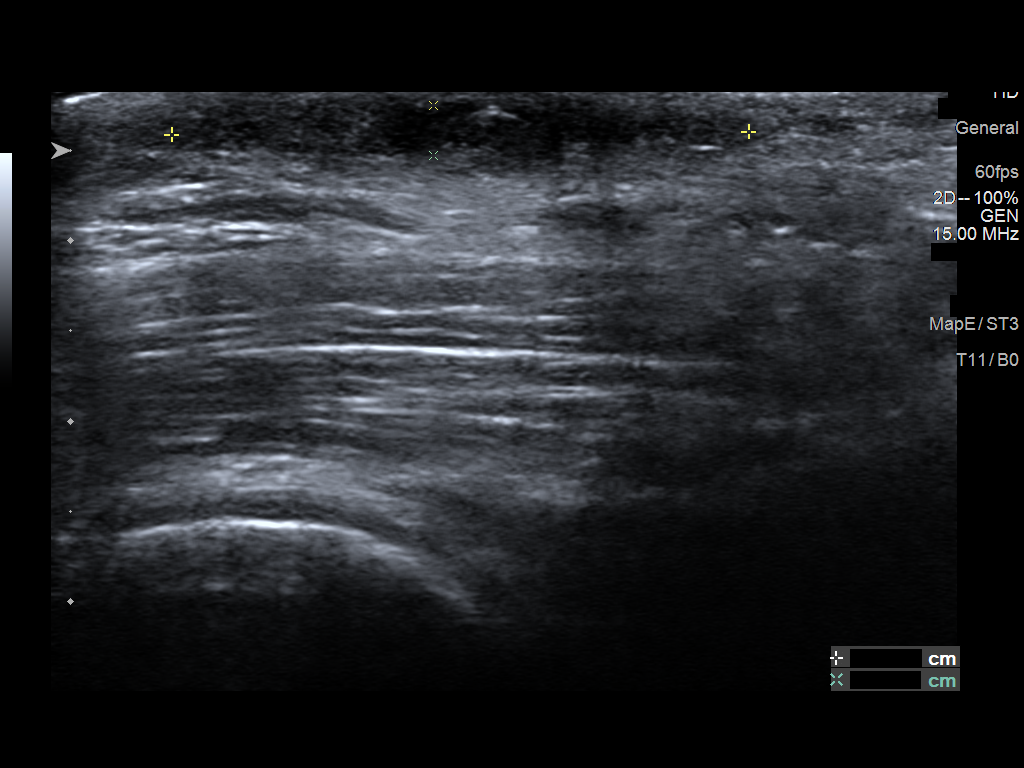
[im 3/5]
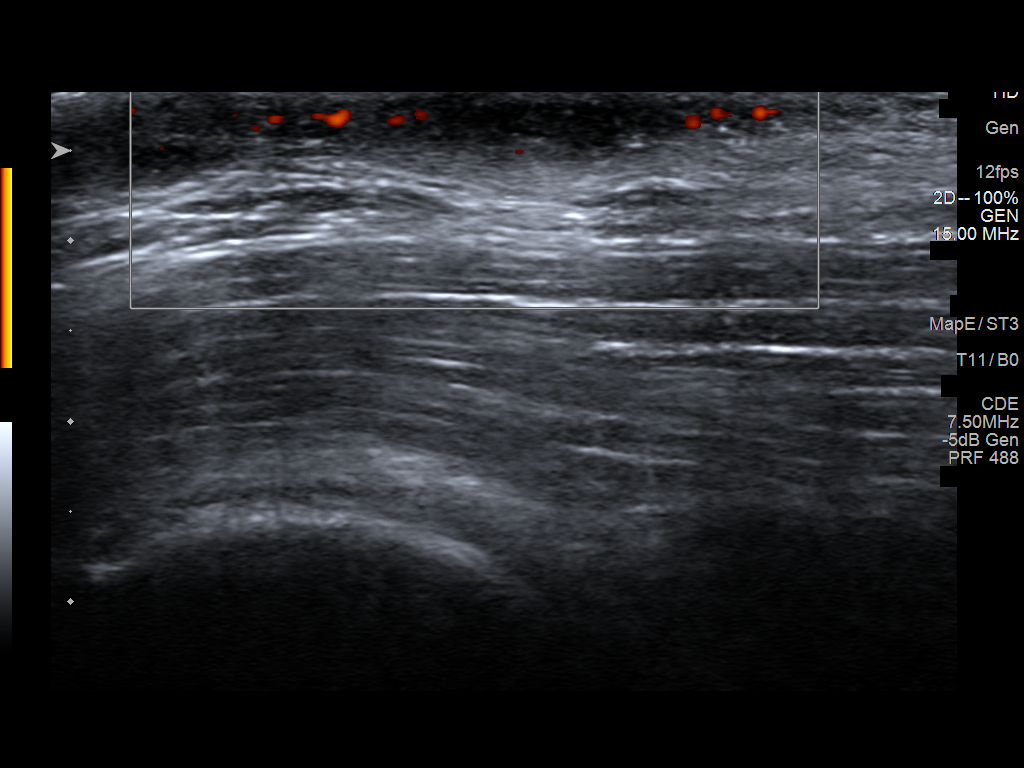
[im 4/5]
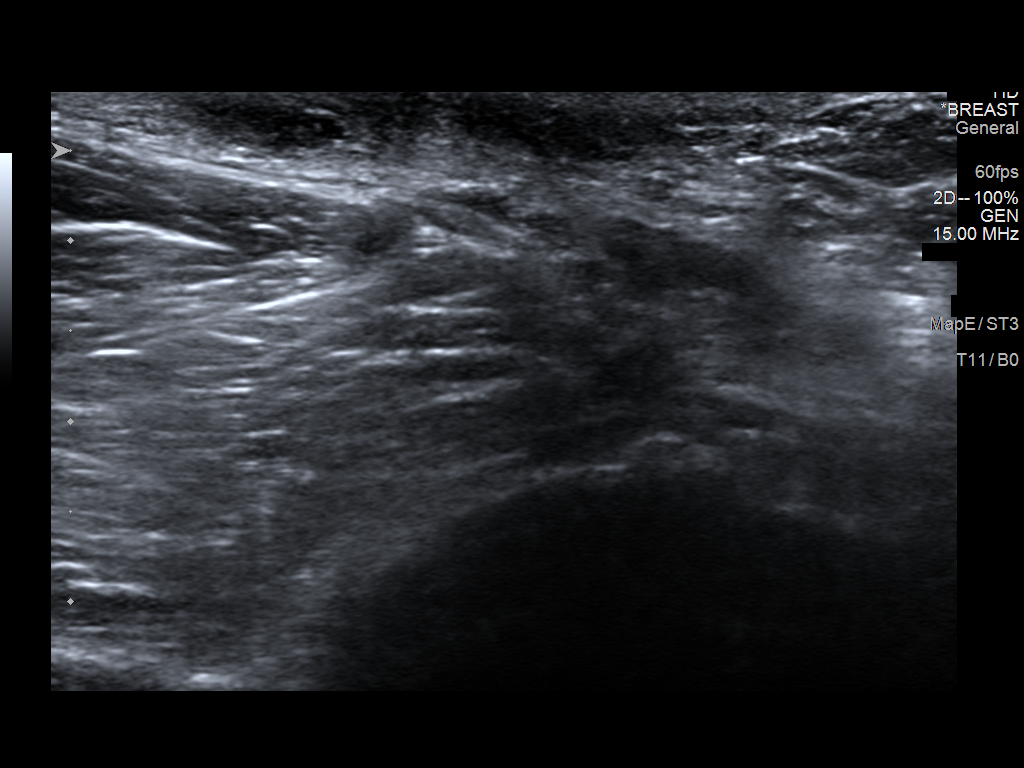
[im 5/5]
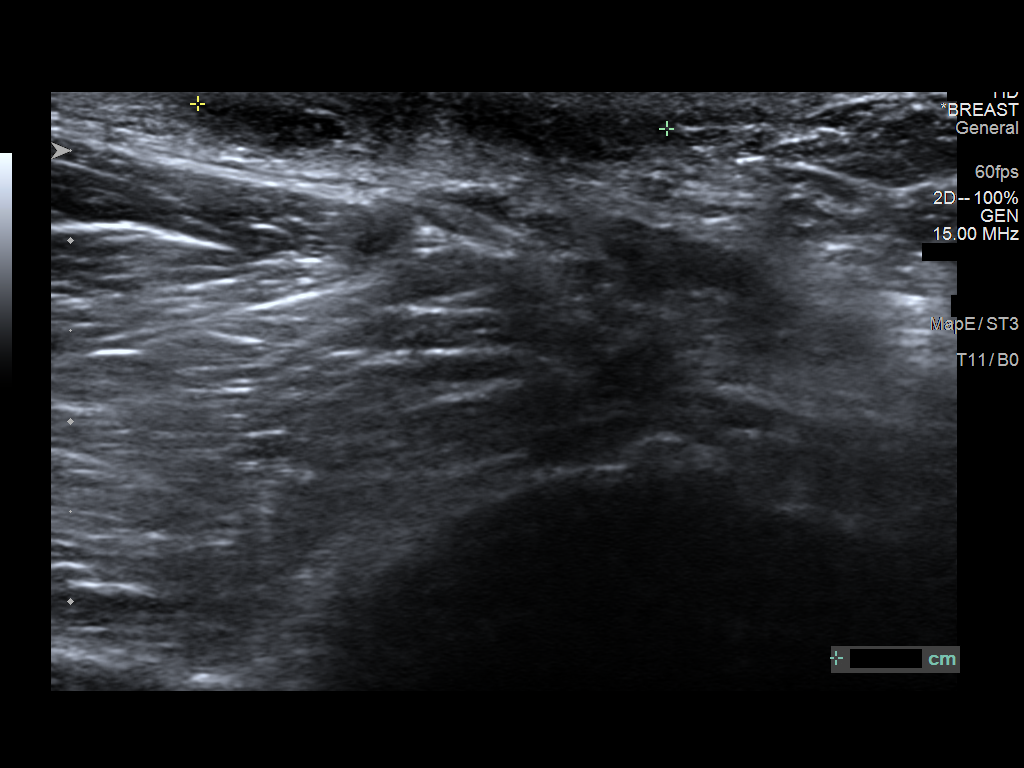

[5 of 5 positions shown; findings below may reference images not displayed]

FINDINGS: On physical exam, fullness in the RIGHT axilla identified without
discrete palpable mass. No overlying erythema or skin changes noted.

Targeted ultrasound is performed, showing mild subcutaneous
edema/inflammation in the RIGHT axilla without focal
collection/abscess, mass, distortion, abnormal shadowing or enlarged
lymph nodes.
IMPRESSION: Mild subcutaneous edema/inflammation in the RIGHT axilla without
abscess or suspicious abnormality. This is compatible with a
improving infectious/inflammatory process given patient history of
significant decrease in size and no overlying erythema.

RECOMMENDATION:
Consider clinical follow-up as indicated. Any further workup should
be based on clinical grounds.

I have discussed the findings and recommendations with the patient.
If applicable, a reminder letter will be sent to the patient
regarding the next appointment.

BI-RADS CATEGORY  2: Benign.

## 2020-12-18 DIAGNOSIS — R519 Headache, unspecified: Secondary | ICD-10-CM | POA: Diagnosis not present

## 2020-12-18 DIAGNOSIS — Z03818 Encounter for observation for suspected exposure to other biological agents ruled out: Secondary | ICD-10-CM | POA: Diagnosis not present

## 2020-12-18 DIAGNOSIS — R509 Fever, unspecified: Secondary | ICD-10-CM | POA: Diagnosis not present

## 2020-12-18 DIAGNOSIS — R059 Cough, unspecified: Secondary | ICD-10-CM | POA: Diagnosis not present

## 2021-02-07 ENCOUNTER — Encounter: Payer: BC Managed Care – PPO | Admitting: Family

## 2021-02-08 ENCOUNTER — Other Ambulatory Visit: Payer: Self-pay

## 2021-02-09 MED ORDER — LISDEXAMFETAMINE DIMESYLATE 70 MG PO CAPS: 70.0000 mg | ORAL_CAPSULE | Freq: Every morning | ORAL | 0 refills | Status: DC

## 2021-02-09 NOTE — Telephone Encounter (Signed)
Vyvanse 70 mg daily, # 30 with no RF"s.RX for above e-scribed and sent to pharmacy on record  [id number] - [place], [place] - [address] AT [facility] [address] [place] [address] Phone: [phone] Fax: [phone]

## 2021-03-01 ENCOUNTER — Other Ambulatory Visit: Payer: Self-pay

## 2021-03-01 ENCOUNTER — Encounter: Payer: Self-pay | Admitting: Family

## 2021-03-01 ENCOUNTER — Ambulatory Visit: Payer: BC Managed Care – PPO | Admitting: Family

## 2021-03-01 VITALS — BP 122/74 | HR 72 | Resp 16 | Ht 67.32 in | Wt 155.6 lb

## 2021-03-01 DIAGNOSIS — R278 Other lack of coordination: Secondary | ICD-10-CM | POA: Diagnosis not present

## 2021-03-01 DIAGNOSIS — Q999 Chromosomal abnormality, unspecified: Secondary | ICD-10-CM | POA: Diagnosis not present

## 2021-03-01 DIAGNOSIS — Z7189 Other specified counseling: Secondary | ICD-10-CM

## 2021-03-01 DIAGNOSIS — Z719 Counseling, unspecified: Secondary | ICD-10-CM

## 2021-03-01 DIAGNOSIS — F819 Developmental disorder of scholastic skills, unspecified: Secondary | ICD-10-CM | POA: Diagnosis not present

## 2021-03-01 DIAGNOSIS — Z79899 Other long term (current) drug therapy: Secondary | ICD-10-CM

## 2021-03-01 DIAGNOSIS — F902 Attention-deficit hyperactivity disorder, combined type: Secondary | ICD-10-CM
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
## 2021-05-21 ENCOUNTER — Other Ambulatory Visit: Payer: Self-pay

## 2021-05-21 MED ORDER — LISDEXAMFETAMINE DIMESYLATE 70 MG PO CAPS: 70.0000 mg | ORAL_CAPSULE | Freq: Every morning | ORAL | 0 refills | Status: DC

## 2021-05-21 NOTE — Telephone Encounter (Signed)
Vyvanse 70 mg daily, # 30 with no RF"s.RX for above e-scribed and sent to pharmacy on record  [id number] - [place], [place] - [address] [place] [address] Phone: [phone] Fax: [phone]   

## 2021-06-08 ENCOUNTER — Telehealth (INDEPENDENT_AMBULATORY_CARE_PROVIDER_SITE_OTHER): Payer: BC Managed Care – PPO | Admitting: Family

## 2021-06-08 ENCOUNTER — Other Ambulatory Visit: Payer: Self-pay

## 2021-06-08 ENCOUNTER — Encounter: Payer: Self-pay | Admitting: Family

## 2021-06-08 DIAGNOSIS — Q999 Chromosomal abnormality, unspecified: Secondary | ICD-10-CM

## 2021-06-08 DIAGNOSIS — R278 Other lack of coordination: Secondary | ICD-10-CM | POA: Diagnosis not present

## 2021-06-08 DIAGNOSIS — Z79899 Other long term (current) drug therapy: Secondary | ICD-10-CM

## 2021-06-08 DIAGNOSIS — F902 Attention-deficit hyperactivity disorder, combined type: Secondary | ICD-10-CM | POA: Diagnosis not present

## 2021-06-08 DIAGNOSIS — J45990 Exercise induced bronchospasm: Secondary | ICD-10-CM | POA: Insufficient documentation

## 2021-06-08 DIAGNOSIS — Z719 Counseling, unspecified: Secondary | ICD-10-CM

## 2021-06-08 DIAGNOSIS — F819 Developmental disorder of scholastic skills, unspecified: Secondary | ICD-10-CM | POA: Diagnosis not present

## 2021-06-08 DIAGNOSIS — Z7189 Other specified counseling: Secondary | ICD-10-CM

## 2021-06-08 NOTE — Progress Notes (Signed)
[facility] [address], [place]. [address] [place] [address] Dept: [phone] Dept Fax: [phone]  Medication Check visit via Virtual Video   Patient ID:  Tina Moody  female DOB: 06-08-1996   25 y.o.   MRN: [id number]   DATE:06/08/21  PCP: [name], MD  Virtual Visit via Video Note  I connected with  Tina Moody on 06/08/21 at  8:30 AM EST by a video enabled telemedicine application and verified that I am speaking with the correct person using two identifiers. Patient/Parent Location: at work   I discussed the limitations, risks, security and privacy concerns of performing an evaluation and management service by telephone and the availability of in person appointments. I also discussed with the parents that there may be a patient responsible charge related to this service. The parents expressed understanding and agreed to proceed.  Provider: Carron Curie, NP  Location: private work location  HPI/CURRENT STATUS: Tina Moody is here for medication management of the psychoactive medications for ADHD and review of educational and behavioral concerns.   Tina Moody currently taking Vyvanse 70 mg daily, which is working well. Takes medication at 7:45 am. Medication tends to wear off around evening. Tina Moody is able to focus through homework.   Tina Moody is eating well (eating breakfast, lunch and dinner). Tina Moody has NO appetite suppression  Sleeping well (goes to bed at 10:30 pm the latest wakes at 7:30 am), sleeping through the night. 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 Routine care as needed. Recent cold.    Family Medical/ Social History: Changes? None Patient Lives with: parents  MENTAL HEALTH: Mental Health Issues:  None    Allergies: No Known Allergies  Current Medications:  Current Outpatient Medications on File Prior to Visit  Medication Sig Dispense Refill   lisdexamfetamine (VYVANSE) 70 MG capsule Take 1 capsule (70 mg total) by mouth every morning. 90 days supply. Supervisor: Tina Rout, MD, DEA:  [id number], NPI:  [id number] 90 capsule 0   Norethindrone Acetate-Ethinyl Estrad-FE (HAILEY 24 FE) 1-20 MG-MCG(24) tablet      No current facility-administered medications on file prior to visit.   Medication Side Effects: None  DIAGNOSES:    ICD-10-CM   1. 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 Dysgraphia  R27.8     4. Learning difficulty  F81.9     5. Medication management  Z79.899     6. Patient counseled  Z71.9     7. Goals of care, counseling/discussion  Z71.89      ASSESSMENT:      Tina Moody is a 25 year old female with a history of ADHD, Learning disability, and chromosomal abnormality. Taking Vyvanse 70 mg daily with reported efficacy and no side effets. 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 No extra assistance needed in the day care setting. Attending work related updates for continuing education. Tina Moody is taking one of her main classes this semester for Early Childhood Educaiton at [employer]. Support services at school are in  place for her learning needs. No reported medical changes or updates in the past several months. NO difficulties reported with eating or sleeping. Getting some activity with chasing kids daily at work, but nothing outside of work. Will continue with Vyvanse with no changes today.   PLAN/RECOMMENDATIONS:  Updates for work, school and  progress provided since her last f/u visit on 03/01/2021.  Discussed continued progress at school with 1 class this semester and getting disability services at school.  No current issues with work or performance of her daily duties. No extra support needed.   Eating well with no changes reported and getting a variety of food each day.   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 Working with the younger children and stays busy.   Discussed daily work schedule along with family/social interactions.  Sleep schedule with good sleep hygiene routine discussed with patient.   Counseled medication pharmacokinetics, options, dosage, administration, desired effects, and possible side effects.   Vyvanse 70 mg daily, no Rx today.   I discussed the assessment and treatment plan with the patient. The patient was provided an opportunity to ask questions and all were answered. The patient agreed with the plan and demonstrated an understanding of the instructions.   NEXT APPOINTMENT:  Visit date not found-F/u in 3 months  Telehealth OK  The patient was advised to call back or seek an in-person evaluation if the symptoms worsen or if the condition fails to improve as anticipated.   Carron Curie, NP

## 2021-06-11 DIAGNOSIS — J209 Acute bronchitis, unspecified: Secondary | ICD-10-CM | POA: Diagnosis not present

## 2021-06-11 DIAGNOSIS — R0981 Nasal congestion: Secondary | ICD-10-CM | POA: Diagnosis not present

## 2021-06-11 DIAGNOSIS — R051 Acute cough: Secondary | ICD-10-CM | POA: Diagnosis not present

## 2021-07-27 DIAGNOSIS — R051 Acute cough: Secondary | ICD-10-CM | POA: Diagnosis not present

## 2021-07-27 DIAGNOSIS — J069 Acute upper respiratory infection, unspecified: Secondary | ICD-10-CM | POA: Diagnosis not present

## 2021-07-27 DIAGNOSIS — J208 Acute bronchitis due to other specified organisms: Secondary | ICD-10-CM | POA: Diagnosis not present

## 2021-07-27 DIAGNOSIS — J029 Acute pharyngitis, unspecified: Secondary | ICD-10-CM | POA: Diagnosis not present

## 2021-08-09 ENCOUNTER — Other Ambulatory Visit: Payer: Self-pay

## 2021-08-09 MED ORDER — LISDEXAMFETAMINE DIMESYLATE 70 MG PO CAPS: 70.0000 mg | ORAL_CAPSULE | Freq: Every morning | ORAL | 0 refills | Status: DC

## 2021-09-14 ENCOUNTER — Telehealth (INDEPENDENT_AMBULATORY_CARE_PROVIDER_SITE_OTHER): Payer: BC Managed Care – PPO | Admitting: Family

## 2021-09-14 ENCOUNTER — Other Ambulatory Visit: Payer: Self-pay

## 2021-09-14 ENCOUNTER — Encounter: Payer: Self-pay | Admitting: Family

## 2021-09-14 DIAGNOSIS — F902 Attention-deficit hyperactivity disorder, combined type: Secondary | ICD-10-CM

## 2021-09-14 DIAGNOSIS — F819 Developmental disorder of scholastic skills, unspecified: Secondary | ICD-10-CM

## 2021-09-14 DIAGNOSIS — R278 Other lack of coordination: Secondary | ICD-10-CM

## 2021-09-14 DIAGNOSIS — Q999 Chromosomal abnormality, unspecified: Secondary | ICD-10-CM

## 2021-09-14 DIAGNOSIS — Z79899 Other long term (current) drug therapy: Secondary | ICD-10-CM

## 2021-09-14 DIAGNOSIS — Z719 Counseling, unspecified: Secondary | ICD-10-CM

## 2021-09-14 DIAGNOSIS — J45990 Exercise induced bronchospasm: Secondary | ICD-10-CM | POA: Diagnosis not present

## 2021-09-14 DIAGNOSIS — Z7189 Other specified counseling: Secondary | ICD-10-CM

## 2021-09-14 NOTE — Progress Notes (Signed)
[name] [address], [place]. [address] [place] [address] Dept: [phone] Dept Fax: [phone]  Medication Check visit via Virtual Video   Patient ID:  Tina Moody  female DOB: [birth year]   26 y.o.   MRN: [id number]   DATE:09/14/21  PCP: [name], MD  Virtual Visit via Video Note  I connected with  Richarda Blade on 09/14/21 at  7:30 AM EST by a video enabled telemedicine application and verified that I am speaking with the correct person using two identifiers. Patient/Parent Location: at home   I discussed the limitations, risks, security and privacy concerns of performing an evaluation and management service by telephone and the availability of in person appointments. I also discussed with the parents that there may be a patient responsible charge related to this service. The parents expressed understanding and agreed to proceed.  Provider: Carron Curie, NP  Location: private work location  HPI/CURRENT STATUS: Suellyn R Moody is here for medication management of the psychoactive medications for ADHD and review of educational and behavioral concerns.   Sheva currently taking Vyvanse 70 mg daily, which is working well. Takes medication daily in the morning. Medication tends to wear off around evening. Luddie is able to focus through school & work.   Princessa is eating well (eating breakfast, lunch and dinner). Maurisa does not have appetite suppression and getting in plenty to eat each day.   Sleeping well (goes to bed at 10:00 pm wakes at 7:35 am), sleeping through the night. Shanedra does not have delayed sleep onset.  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    MEDICAL HISTORY: Individual Medical History/ Review of Systems: Yes, viral illnesses with medication as needed  Has been healthy with no visits to the PCP. WCC due yearly GYN. Drinking juice with extra vitamins with sea moss.   Family Medical/ Social History: Changes? None Patient Lives with: parents  MENTAL HEALTH: Mental Health Issues: None     Allergies: No Known Allergies  Current Medications:  Current Outpatient Medications on File Prior to Visit  Medication Sig Dispense Refill   albuterol (VENTOLIN HFA) 108 (90 Base) MCG/ACT inhaler Inhale 2 puffs into the lungs every 6 (six) hours as needed.     lisdexamfetamine (VYVANSE) 70 MG capsule Take 1 capsule (70 mg total) by mouth every morning. 90 capsule 0   Norethindrone Acetate-Ethinyl Estrad-FE (HAILEY 24 FE) 1-20 MG-MCG(24) tablet      No current facility-administered medications on file prior to visit.   Medication Side Effects: None  DIAGNOSES:    ICD-10-CM   1. 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 Dysgraphia  R27.8     4. Exercise-induced asthma  J45.990     5. Learning difficulty  F81.9     6. Medication management  Z79.899     7. Patient counseled  Z71.9     8. Goals of care, counseling/discussion  Z71.89      ASSESSMENT:     Tina Moody is a 26 year old female with a history of ADHD, learning, dysgraphia and autosomal abnormality. Has continued with Vyvanse 70 mg daily with no  side effects and good efficacy. Not taking class this semester and enrolled in the fall semester at [employer]. Getting disability services for her learning needs along with attention at school. 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 More frustration with work and limited staffing. Getting extra help from the director during the day due to limited staffing members. No changes with eating, sleeping or health in the  past 3 months. Medical updates with PCP and GYN as needed. No change with Vyvanse or her daily dosing today.   PLAN/RECOMMENDATIONS:  Updates with work, schedule, hours and recent changes with staff in the past few months.  Working full time with increased stressors and staffing shortages at work discussed.  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  Enrolled in [employer] for the fall semester for her childhood class. Not taking class this semester due to school failure with enrollment for the spring.  Disability services in place at school for support related to her learning and attention needs.   Eating well with no changes reported and eating a variety of foods with meals/snacks.  No other outside activity due to constant activity at work during the work week. Some peer interactions and socialization outside of work.  Updates with medical f/u visits and any appts in the past few months.   Sleep schedule and sleep hygiene discussed with patient with no concerns.   Counseled medication pharmacokinetics, options, dosage, administration, desired effects, and possible side effects.   Vyvanse 70 mg daily, no Rx today   I discussed the assessment and treatment plan with the patient. The patient was provided an opportunity to ask questions and all were answered. The patient agreed with the plan and demonstrated an understanding of the instructions.   NEXT APPOINTMENT:  Visit date not found-f/u visit in May 2023 Telehealth OK  The patient was advised to call back or seek an in-person evaluation if the symptoms worsen or if the condition fails to improve as anticipated.   Carron Curie, NP

## 2021-10-19 ENCOUNTER — Other Ambulatory Visit: Payer: Self-pay

## 2021-10-19 MED ORDER — LISDEXAMFETAMINE DIMESYLATE 70 MG PO CAPS: 70.0000 mg | ORAL_CAPSULE | Freq: Every morning | ORAL | 0 refills | Status: DC

## 2021-10-19 NOTE — Telephone Encounter (Signed)
Vyvanse 70 mg daily, # 30 with no RF's.RX for above e-scribed and sent to pharmacy on record  [id number] - [place], [place] - [address] [place] [address] Phone: [phone] Fax: [phone]   

## 2021-12-12 ENCOUNTER — Encounter: Payer: Self-pay | Admitting: Family

## 2021-12-12 ENCOUNTER — Ambulatory Visit (INDEPENDENT_AMBULATORY_CARE_PROVIDER_SITE_OTHER): Payer: BC Managed Care – PPO | Admitting: Family

## 2021-12-12 VITALS — BP 112/78 | HR 76 | Resp 16 | Ht 67.0 in | Wt 164.8 lb

## 2021-12-12 DIAGNOSIS — Q999 Chromosomal abnormality, unspecified: Secondary | ICD-10-CM

## 2021-12-12 DIAGNOSIS — F902 Attention-deficit hyperactivity disorder, combined type: Secondary | ICD-10-CM

## 2021-12-12 DIAGNOSIS — R278 Other lack of coordination: Secondary | ICD-10-CM

## 2021-12-12 DIAGNOSIS — Z79899 Other long term (current) drug therapy: Secondary | ICD-10-CM | POA: Diagnosis not present

## 2021-12-12 DIAGNOSIS — Z719 Counseling, unspecified: Secondary | ICD-10-CM

## 2021-12-12 DIAGNOSIS — Z7189 Other specified counseling: Secondary | ICD-10-CM

## 2021-12-12 NOTE — Progress Notes (Signed)
?[facility] ?[facility] ?[facility] ?[address], [address] ?[place] [address] ?Dept: [phone] ?Dept Fax: [phone] ?Loc: [phone] ?Loc Fax: [phone] ? ?Medication Check ? ?Patient ID: Tina Moody, female  DOB: Feb 27, 1996, 26 y.o.  MRN: [id number] ? ?Date of Evaluation: 12/12/2021  ?PCP: [name], MD ? ?Accompanied by:  self ?Patient Lives with: parents ? ?HISTORY/CURRENT STATUS: ?HPI Patient here by herself for the visit. Interactive and appropriate with provider today. Patient doing well with work and health with no changes reported since last f/u visit on 09/14/2021. Has continued with Vyvanse 70 mg daily with no changes or issues reported.  ? ?EDUCATION/WORK: ?[employer] ?Monday through Friday ?Activities/ Exercise:  busy with her job and chasing kids daily ? ?MEDICAL HISTORY: ?Appetite: Good  MVI/Other: None ? ?Sleep: Bedtime: 1:00 am the latest  Awakens: 7:35 am  Concerns: Initiation/Maintenance/Other: watching videos at night and may cause sleep delay ? ?Individual Medical History/ Review of Systems: Changes? :None reported  ? ?Allergies: Patient has no known allergies. ? ?Current Medications:  ?Current Outpatient Medications  ?Medication Instructions  ? albuterol (VENTOLIN HFA) 108 (90 Base) MCG/ACT inhaler 2 puffs, Inhalation, Every 6 hours PRN  ? lisdexamfetamine (VYVANSE) 70 mg, Oral, Every morning  ? Norethindrone Acetate-Ethinyl Estrad-FE (HAILEY 24 FE) 1-20 MG-MCG(24) tablet No dose, route, or frequency recorded.  ? ?Medication Side Effects: None ?Family Medical/ Social History: Changes? None reported ? ?MENTAL HEALTH: ?Mental Health Issues:  None ? ?PHYSICAL EXAM; ?Vitals:  ?Vitals:  ? 12/12/21 0734  ?BP: 112/78  ?Pulse: 76  ?Resp: 16  ?Weight: 164 lb 12.8 oz (74.8 kg)  ?Height: 5\' 7"  (1.702 m)  ?  ?General Physical Exam: ?Unchanged from previous exam,  date:09/14/2021 Changed:None ? ?DIAGNOSES:  ?  ICD-10-CM   ?1. ADHD (attention deficit hyperactivity disorder), combined type  F90.2   ?  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  ?3. Dysgraphia  R27.8   ?  ?4. Medication management  Z79.899   ?  ?5. Patient counseled  Z71.9   ?  ?6. Goals of care, counseling/discussion  Z71.89   ?  ? ?ASSESSMENT: ?Tina Moody is a 26 year old female with a history of ADHD, learning, Dysgraphia, and chromosomal abnormalities. Patient has been maintained on Vyvanse 70 mg daily with good efficacy and good response for the work day. Enrolled for the fall semester at [employer] Has disability services in place still at school. 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 Work has continued with staffing issues and still waiting for another teacher to assist in her classroom. No changes with eating, sleeping or health in the past 3 months. Has GYN scheduled in the next 2 months. No changes with her Vyvanse or dose today.  ? ?RECOMMENDATIONS:  ?Updates with work, family, health and medical changes over the past 3 months. ? ?Discussed school and enrollment for the fall for classes needed for her early childhood education degree. ? ?Disability services in place at school for academic and attention support needed.  ? ?Work difficulties and responsibility discussed with minimal staffing available for assistance.  ? ?Support given to patient regarding her hard work and continued effort with her work 3. ? ?Updates for medical changes and f/u recommended for annual visits. ? ?Eating well with no concerns and getting some activity at work daily. ? ?Sleep schedule discussed and sleep hygiene reviewed with patient related to electronics  at [facility]. ? ?Limitation of electronic use to 1 hour before bedtime for initiation of sleep.  ? ?Counseled medication pharmacokinetics, options, dosage, administration, desired effects, and possible side effects.   ?Vyvanse 70 mg daily, no Rx  today ? ?I discussed the assessment and treatment plan with the patient. The patient was provided an opportunity to ask questions and all were answered. The patient agreed with the plan and demonstrated an understanding of the instructions. ? ?NEXT APPOINTMENT: Return in about 3 months (around 03/14/2022) for f/u visit . ?  ?The patient was advised to call back or seek an in-person evaluation if the symptoms worsen or if the condition fails to improve as anticipated. ?  ?Carron Curie, NP  ?

## 2021-12-31 DIAGNOSIS — Z01419 Encounter for gynecological examination (general) (routine) without abnormal findings: Secondary | ICD-10-CM | POA: Diagnosis not present

## 2021-12-31 DIAGNOSIS — R87610 Atypical squamous cells of undetermined significance on cytologic smear of cervix (ASC-US): Secondary | ICD-10-CM | POA: Diagnosis not present

## 2021-12-31 DIAGNOSIS — Z6824 Body mass index (BMI) 24.0-24.9, adult: Secondary | ICD-10-CM | POA: Diagnosis not present

## 2021-12-31 DIAGNOSIS — Z118 Encounter for screening for other infectious and parasitic diseases: Secondary | ICD-10-CM | POA: Diagnosis not present

## 2022-02-01 ENCOUNTER — Other Ambulatory Visit: Payer: Self-pay

## 2022-02-02 MED ORDER — LISDEXAMFETAMINE DIMESYLATE 70 MG PO CAPS: 70.0000 mg | ORAL_CAPSULE | Freq: Every morning | ORAL | 0 refills | Status: DC

## 2022-02-02 NOTE — Telephone Encounter (Signed)
Vyvanse 70 mg daily, # 30 with no RF's.RX for above e-scribed and sent to pharmacy on record  [id number] - [place], [place] - [address] [facility] [address] [place] [address] Phone: [phone] Fax: [phone]

## 2022-02-19 ENCOUNTER — Other Ambulatory Visit: Payer: Self-pay

## 2022-02-19 MED ORDER — LISDEXAMFETAMINE DIMESYLATE 70 MG PO CAPS: 70.0000 mg | ORAL_CAPSULE | Freq: Every morning | ORAL | 0 refills | Status: DC

## 2022-02-19 NOTE — Telephone Encounter (Signed)
Vyvanse 70 mg daily, # 30 with no RF's.RX for above e-scribed and sent to pharmacy on record  [facility] - [place], [place] - [address] [place] [address] Phone: [phone] Fax: [phone]   

## 2022-02-19 NOTE — Telephone Encounter (Signed)
[facility] mom would like it sent to [facility].

## 2022-02-21 DIAGNOSIS — M79674 Pain in right toe(s): Secondary | ICD-10-CM | POA: Diagnosis not present

## 2022-02-25 DIAGNOSIS — S93504A Unspecified sprain of right lesser toe(s), initial encounter: Secondary | ICD-10-CM | POA: Diagnosis not present

## 2022-03-01 ENCOUNTER — Encounter: Payer: Self-pay | Admitting: Family

## 2022-03-01 ENCOUNTER — Telehealth (INDEPENDENT_AMBULATORY_CARE_PROVIDER_SITE_OTHER): Payer: BC Managed Care – PPO | Admitting: Family

## 2022-03-01 DIAGNOSIS — Z79899 Other long term (current) drug therapy: Secondary | ICD-10-CM

## 2022-03-01 DIAGNOSIS — J45990 Exercise induced bronchospasm: Secondary | ICD-10-CM

## 2022-03-01 DIAGNOSIS — F902 Attention-deficit hyperactivity disorder, combined type: Secondary | ICD-10-CM | POA: Diagnosis not present

## 2022-03-01 DIAGNOSIS — R278 Other lack of coordination: Secondary | ICD-10-CM | POA: Diagnosis not present

## 2022-03-01 DIAGNOSIS — Q999 Chromosomal abnormality, unspecified: Secondary | ICD-10-CM

## 2022-03-01 DIAGNOSIS — Z719 Counseling, unspecified: Secondary | ICD-10-CM

## 2022-03-01 DIAGNOSIS — Z7189 Other specified counseling: Secondary | ICD-10-CM

## 2022-03-01 NOTE — Progress Notes (Signed)
[facility]. [address] [place] [address] Dept: [phone] Dept Fax: [phone]  Medication Check visit via Virtual Video   Patient ID:  Tina Moody  female DOB: 1995/12/30   26 y.o.   MRN: [id number]   DATE:03/01/22  PCP: Donald Prose, MD  Virtual Visit via Video Note  I connected with  Monico Hoar on 03/01/22 at  7:30 AM EDT by a video enabled telemedicine application and verified that I am speaking with the correct person using two identifiers. Mother joined the call part way through due to some concerns. Patient/Parent Location: at home  I discussed the limitations, risks, security and privacy concerns of performing an evaluation and management service by telephone and the availability of in person appointments. I also discussed with the parents that there may be a patient responsible charge related to this service. The parents expressed understanding and agreed to proceed.  Provider: [name], NP  Location: private work location  HPI/CURRENT STATUS: Tina Moody is here for medication management of the psychoactive medications for ADHD and review of educational and behavioral concerns.   Ashleen currently taking Vyvanse in the morning, which is working well. Takes medication at 7:45 am. Medication tends to wear off around evening time. Kianni is able to focus through homework.   Ayanna is eating well (eating breakfast, lunch and dinner). Aracelis does not have appetite suppression and well with no problems.   Sleeping well (goes to bed at 2200-2300 wakes at 0735 for work), sleeping through the night. Roshan does not have delayed sleep onset with no concerns.  EDUCATION/WORK: Work: [employer] Monday through Friday No reported changes to her job duties 8:15 am until 5:45 pm  Activities/ Exercise: intermittently  MEDICAL HISTORY: Individual Medical  History/ Review of Systems: Yes, recent toe injury with fracture and in a boot from orthopedic. X-ray with crutches for treatment. Has recent GYN visit for routine care. Has been healthy with no visits to the PCP. Clifford due yearly.    Family Medical/ Social History: Yes, mother attempting to obtain guardianship.  Patient Lives with: parents  MENTAL HEALTH: Mental Health Issues:    None reported     Allergies: No Known Allergies  Current Medications:  Current Outpatient Medications on File Prior to Visit  Medication Sig Dispense Refill   albuterol (VENTOLIN HFA) 108 (90 Base) MCG/ACT inhaler Inhale 2 puffs into the lungs every 6 (six) hours as needed.     lisdexamfetamine (VYVANSE) 70 MG capsule Take 1 capsule (70 mg total) by mouth every morning. 90 capsule 0   Norethindrone Acetate-Ethinyl Estrad-FE (HAILEY 24 FE) 1-20 MG-MCG(24) tablet      No current facility-administered medications on file prior to visit.   Medication Side Effects: None  DIAGNOSES:    ICD-10-CM   1. 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 Dysgraphia  R27.8     4. Exercise-induced asthma  J45.990     5. Medication management  Z79.899     6. Patient counseled  Z71.9     7. Goals of care, counseling/discussion  Z71.89      ASSESSMENT:    Mickie is a 26 year old female with a history of ADHD, L/D, Chromosomal abnormality, and intellectual disability. Has been maintained on Vyvanse 70 mg daily with efficacy and no side effects. 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 No changes or concerns with her work duties or daily performance. Not much activity outside of her work, but does participate with family outings. Recent injury to her toe with a fracture and boot apply, but no f/u scheduled. Eating well with no changes and staying hydrated. Sleeping with no concerns or changes. Mother on the call related to concerns  with money missing from Tina Moody' bank account and loaning a man she met online a large sum of money. Worried that this will continue to happen due to her IDD and wanting retesting completed. Medication to continue with no changes. Information to be provided to mother regarding concerns.   PLAN/RECOMMENDATIONS:  Work updates with no significant changes reported recently and maintaining the same schedule.  No issues with work performance or assistance needed for daily duties related to her work.   Recent issues with missing money and loaning to unknown person online discussed with mother.  Suggested obtaining paper work for guardianship due to her IDD and chromosomal abnormality.  Encouraged retesting for learning and IQ with suggestions for providers to complete the testing.  Support given for safety with online communication and limitation with contact due to her age.   Sleep schedule discussed with patient along with sleep hygiene  Medication management with current medicine and dose with no changes needed.   Counseled medication pharmacokinetics, options, dosage, administration, desired effects, and possible side effects.   Vyvanse 70 mg daily, no Rx today   I discussed the assessment and treatment plan with the patient. The patient was provided an opportunity to ask questions and all were answered. The patient agreed with the plan and demonstrated an understanding of the instructions.   NEXT APPOINTMENT:  Visit date not found-f/u visit for routine care Telehealth OK  The patient was advised to call back or seek an in-person evaluation if the symptoms worsen or if the condition fails to improve as anticipated.   [name], NP

## 2022-04-29 ENCOUNTER — Other Ambulatory Visit: Payer: Self-pay

## 2022-04-29 MED ORDER — LISDEXAMFETAMINE DIMESYLATE 70 MG PO CAPS: 70.0000 mg | ORAL_CAPSULE | Freq: Every morning | ORAL | 0 refills | Status: DC

## 2022-04-29 NOTE — Telephone Encounter (Signed)
Vyvanse 70 mg daily, # 30 with no RF's.RX for above e-scribed and sent to pharmacy on record  [place], [place] [address] Phone: [phone] Fax: [phone]

## 2022-05-20 ENCOUNTER — Other Ambulatory Visit: Payer: Self-pay

## 2022-05-20 NOTE — Telephone Encounter (Signed)
error 

## 2022-06-14 ENCOUNTER — Telehealth (INDEPENDENT_AMBULATORY_CARE_PROVIDER_SITE_OTHER): Payer: BC Managed Care – PPO | Admitting: Family

## 2022-06-14 ENCOUNTER — Encounter: Payer: Self-pay | Admitting: Family

## 2022-06-14 DIAGNOSIS — Z79899 Other long term (current) drug therapy: Secondary | ICD-10-CM

## 2022-06-14 DIAGNOSIS — Z7189 Other specified counseling: Secondary | ICD-10-CM

## 2022-06-14 DIAGNOSIS — F902 Attention-deficit hyperactivity disorder, combined type: Secondary | ICD-10-CM

## 2022-06-14 DIAGNOSIS — R278 Other lack of coordination: Secondary | ICD-10-CM

## 2022-06-14 DIAGNOSIS — Q999 Chromosomal abnormality, unspecified: Secondary | ICD-10-CM

## 2022-06-14 DIAGNOSIS — J45990 Exercise induced bronchospasm: Secondary | ICD-10-CM

## 2022-06-14 DIAGNOSIS — Z719 Counseling, unspecified: Secondary | ICD-10-CM

## 2022-06-14 NOTE — Progress Notes (Signed)
[facility] [address], [place]. [address] [place] [address] Dept: [phone] Dept Fax: [phone]  Medication Check visit via Virtual Video   Patient ID:  Tina Moody  female DOB: Aug 04, 1995   26 y.o.   MRN: [id number]   DATE:06/14/22  PCP: [name], MD  Virtual Visit via Video Note  I connected with  Tina Moody on 06/14/22 at  7:30 AM EST by a video enabled telemedicine application and verified that I am speaking with the correct person using two identifiers. Patient/Parent Location: at home  I discussed the limitations, risks, security and privacy concerns of performing an evaluation and management service by telephone and the availability of in person appointments. I also discussed with the parents that there may be a patient responsible charge related to this service. The parents expressed understanding and agreed to proceed.  Provider: Carron Curie, NP  Location: private work location  HPI/CURRENT STATUS: Tina Moody is here for medication management of the psychoactive medications for ADHD and review of educational and behavioral concerns.   Tina Moody currently taking Vyvanse 70 mg daily, which is working well. Takes medication at 0700. Medication tends to wear off around evening. Tina Moody is able to focus through work.   Tina Moody is eating well (eating breakfast, lunch and dinner). Tina Moody has does not have appetite suppression and no recent concerns.   Sleeping well (goes to bed at  2100-2200 wakes at 0735), sleeping through the night. Tina Moody does not have delayed sleep onset  EDUCATION/WORK: [employer] Monday through Friday Short staffed and having to cover other rooms Hours:8:15 am- 5:30 pm most nights  Activities/ Exercise: intermittently  MEDICAL HISTORY: Individual Medical History/ Review of Systems: None reported. Has been healthy with no visits to the PCP. WCC due  yearly.   Family Medical/ Social History: None Tina Moody Lives with: parents and sibling  MENTAL HEALTH: Mental Health Issues:  None reported     Allergies: No Known Allergies  Current Medications:  Current Outpatient Medications on File Prior to Visit  Medication Sig Dispense Refill   albuterol (VENTOLIN HFA) 108 (90 Base) MCG/ACT inhaler Inhale 2 puffs into the lungs every 6 (six) hours as needed.     lisdexamfetamine (VYVANSE) 70 MG capsule Take 1 capsule (70 mg total) by mouth every morning. 90 capsule 0   Norethindrone Acetate-Ethinyl Estrad-FE (HAILEY 24 FE) 1-20 MG-MCG(24) tablet      No current facility-administered medications on file prior to visit.   Medication Side Effects: None  DIAGNOSES:    ICD-10-CM   1. ADHD (attention deficit hyperactivity disorder), combined type  F90.2     2. 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 Exercise-induced asthma  J45.990     5. Medication management  Z79.899     6. Patient counseled  Z71.9     7. Goals of care, counseling/discussion  Z71.89      ASSESSMENT:      Tina Moody is a 26 year old female with a history of ADHD, L/D, and chromosomal abnormality. Has been well controlled on Vyvanse 70 mg daily with no side effects reported. Efficacy daily with current dose. 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 Eating well with no concerns, Healthcare has not changed. Sleeping well with no issues. Medication has remained the same with no issues  reported.   PLAN/RECOMMENDATIONS:  Updates with work and changes with work related to limited help.  Discussed hours and duties at work with support given with the current situation.  Patient has had no recent health issues but suggested routine f/u visit with PCP along with GYN for ongoing care.  Eating well with good daily intake and no issues with eating a variety of  foods.  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 Not exercising or moving outside of the work day.  Sleep habits discussed with patient and currently on a routine schedule for the work week.   Medication management discussed with patient and recent RF with generic medication. Patient to report if having any side effects or limited efficacy.   Counseled medication pharmacokinetics, options, dosage, administration, desired effects, and possible side effects.   Vyvanse 70 mg daily, no Rx today   I discussed the assessment and treatment plan with Tina Moody. Tina Moody was provided an opportunity to ask questions and all were answered. Tina Moody agreed with the plan and demonstrated an understanding of the instructions.  REVIEW OF CHART, FACE TO FACE CLINIC TIME AND DOCUMENTATION TIME DURING TODAY'S VISIT:  30 mins      NEXT APPOINTMENT:  Visit date not found-3 month f/u Telehealth OK  The patient was advised to call back or seek an in-person evaluation if the symptoms worsen or if the condition fails to improve as anticipated.   [name], NP

## 2022-08-22 ENCOUNTER — Other Ambulatory Visit: Payer: Self-pay

## 2022-08-22 MED ORDER — LISDEXAMFETAMINE DIMESYLATE 70 MG PO CAPS: 70.0000 mg | ORAL_CAPSULE | Freq: Every day | ORAL | 0 refills | Status: AC

## 2022-08-22 MED ORDER — LISDEXAMFETAMINE DIMESYLATE 70 MG PO CAPS: 70.0000 mg | ORAL_CAPSULE | Freq: Every morning | ORAL | 0 refills | Status: AC

## 2022-08-22 NOTE — Telephone Encounter (Signed)
Vyvanse 70 mg #30 with no RF's and posted dated Rx for 11/21/2022.RX for above e-scribed and sent to pharmacy on record  [facility] [address] [place] [address] Phone: [phone] Fax: [phone]

## 2022-09-10 ENCOUNTER — Telehealth (INDEPENDENT_AMBULATORY_CARE_PROVIDER_SITE_OTHER): Payer: Self-pay

## 2022-09-10 NOTE — Telephone Encounter (Signed)
  Name of who is calling: Kenney Houseman  Caller's Relationship to Patient: Mother  Best contact number: [phone]  Provider they see: Arrie Aran [facility])  Reason for call: Lattie Haw is confused as to what the next steps are as far as scheduling an appointment and continuing medications. She stated she received a letter in the mail after the fact that did not answer her questions.     PRESCRIPTION REFILL ONLY  Name of prescription:  Pharmacy:

## 2022-09-10 NOTE — Telephone Encounter (Signed)
Recalled Tina Moody and informed that we do not have a provider for [facility] at the moment and placed Tina Moody on the waitlist. Tina Moody does not need any refills at the moment.

## 2022-09-27 DIAGNOSIS — Z03818 Encounter for observation for suspected exposure to other biological agents ruled out: Secondary | ICD-10-CM | POA: Diagnosis not present

## 2022-09-27 DIAGNOSIS — J029 Acute pharyngitis, unspecified: Secondary | ICD-10-CM | POA: Diagnosis not present

## 2022-09-27 DIAGNOSIS — R0981 Nasal congestion: Secondary | ICD-10-CM | POA: Diagnosis not present

## 2022-09-27 DIAGNOSIS — J069 Acute upper respiratory infection, unspecified: Secondary | ICD-10-CM | POA: Diagnosis not present

## 2022-09-27 DIAGNOSIS — R051 Acute cough: Secondary | ICD-10-CM | POA: Diagnosis not present

## 2022-09-30 ENCOUNTER — Telehealth: Payer: Self-pay

## 2022-09-30 NOTE — Telephone Encounter (Signed)
A user error has taken place: encounter opened in error, closed for administrative reasons.

## 2022-10-11 ENCOUNTER — Telehealth: Payer: BC Managed Care – PPO | Admitting: Family

## 2023-02-20 ENCOUNTER — Telehealth (INDEPENDENT_AMBULATORY_CARE_PROVIDER_SITE_OTHER): Payer: Self-pay

## 2023-02-20 NOTE — Telephone Encounter (Signed)
Spoke with patients mother (DPR on file in [employer] 2021). I explained the situation regarding Adana age and that unfortunately that Lynden Ang was not able to see Kimari on the 1st due to this. Mother expressed frustration regarding the transition from [facility] to our office. She stated the patient did not get what she needed prior to the transition. Mother stated she would see if Dr. Wynelle Link would prescribe the medicine in the short term until they could get into an adult practice.  She did ask that I reach out to Dr. Artis Flock to see if there was anything that she could do to help in the meantime of trying to get into an adult clinic. I explained Dr. Artis Flock is out of the office until August 5th but I would route the message to her for when she returns. Mother verbalized understanding and stated she would reach out to patients PCP in the meantime. I apologized for the frustration and stress this has caused her and the patient.

## 2023-02-25 DIAGNOSIS — F900 Attention-deficit hyperactivity disorder, predominantly inattentive type: Secondary | ICD-10-CM | POA: Diagnosis not present

## 2023-02-27 ENCOUNTER — Encounter (INDEPENDENT_AMBULATORY_CARE_PROVIDER_SITE_OTHER): Payer: BC Managed Care – PPO | Admitting: Child and Adolescent Psychiatry

## 2023-07-11 DIAGNOSIS — Z01419 Encounter for gynecological examination (general) (routine) without abnormal findings: Secondary | ICD-10-CM | POA: Diagnosis not present

## 2023-08-12 DIAGNOSIS — F900 Attention-deficit hyperactivity disorder, predominantly inattentive type: Secondary | ICD-10-CM | POA: Diagnosis not present

## 2023-08-27 ENCOUNTER — Other Ambulatory Visit (HOSPITAL_COMMUNITY): Payer: Self-pay

## 2023-08-27 MED ORDER — LISDEXAMFETAMINE DIMESYLATE 70 MG PO CAPS
70.0000 mg | ORAL_CAPSULE | Freq: Every morning | ORAL | 0 refills | Status: DC
  Filled 2023-08-27 – 2023-08-28 (×4): qty 90, 90d supply, fill #0

## 2023-08-28 ENCOUNTER — Other Ambulatory Visit (HOSPITAL_COMMUNITY): Payer: Self-pay

## 2023-09-14 DIAGNOSIS — Z20822 Contact with and (suspected) exposure to covid-19: Secondary | ICD-10-CM | POA: Diagnosis not present

## 2023-09-14 DIAGNOSIS — B349 Viral infection, unspecified: Secondary | ICD-10-CM | POA: Diagnosis not present

## 2023-09-14 DIAGNOSIS — R051 Acute cough: Secondary | ICD-10-CM | POA: Diagnosis not present

## 2023-09-25 ENCOUNTER — Encounter (INDEPENDENT_AMBULATORY_CARE_PROVIDER_SITE_OTHER): Payer: Self-pay

## 2023-11-28 ENCOUNTER — Other Ambulatory Visit (HOSPITAL_COMMUNITY): Payer: Self-pay

## 2023-11-29 ENCOUNTER — Other Ambulatory Visit (HOSPITAL_COMMUNITY): Payer: Self-pay

## 2023-11-29 MED ORDER — LISDEXAMFETAMINE DIMESYLATE 70 MG PO CAPS
70.0000 mg | ORAL_CAPSULE | Freq: Every morning | ORAL | 0 refills | Status: DC
  Filled 2023-11-29: qty 90, 90d supply, fill #0

## 2023-12-01 ENCOUNTER — Other Ambulatory Visit (HOSPITAL_COMMUNITY): Payer: Self-pay

## 2024-01-20 ENCOUNTER — Encounter (HOSPITAL_COMMUNITY): Payer: Self-pay | Admitting: *Deleted

## 2024-01-20 ENCOUNTER — Ambulatory Visit (HOSPITAL_COMMUNITY)
Admission: EM | Admit: 2024-01-20 | Discharge: 2024-01-20 | Disposition: A | Discharge status: Home / Self Care | Attending: Family Medicine | Admitting: Family Medicine

## 2024-01-20 DIAGNOSIS — B354 Tinea corporis: Secondary | ICD-10-CM | POA: Diagnosis not present

## 2024-01-20 MED ORDER — CLOTRIMAZOLE 1 % EX CREA: TOPICAL_CREAM | CUTANEOUS | 0 refills | Status: AC

## 2024-01-20 NOTE — ED Triage Notes (Signed)
 Pt states she has a ring worm on her right breast she noticed it today.

## 2024-01-21 NOTE — ED Provider Notes (Signed)
  [facility]   [id number] 01/20/24 Arrival Time: 1919  ASSESSMENT & PLAN:  1. Tinea corporis    No signs of bacterial skin infection. Begin: Meds ordered this encounter  Medications   clotrimazole (LOTRIMIN) 1 % cream    Sig: Apply to affected area 2 times daily for 1-2 weeks.    Dispense:  28 g    Refill:  0     Follow-up Information     Sun, Vyvyan, MD.   Specialty: Family Medicine Why: As needed. Contact information: [address] [place] [address] [phone]         [facility].   Specialty: Urgent Care Why: If worsening or failing to improve as anticipated. Contact information: [address] [place]  [address] [phone]                Reviewed expectations re: course of current medical issues. Questions answered. Outlined signs and symptoms indicating need for more acute intervention. Understanding verbalized. After Visit Summary given.   SUBJECTIVE: History from: Patient. Tina Moody is a 28 y.o. female. Pt states she has a ring worm on her right breast she noticed it today.  Mild itching. No pets at home. No tx PTA. Denies fever.  OBJECTIVE:  Vitals:   01/20/24 1932  BP: (!) 135/91  Pulse: 80  Resp: 18  Temp: 98.5 F (36.9 C)  TempSrc: Oral  SpO2: 98%    General appearance: alert; no distress Skin: warm and dry; solitary approx 1 cm diameter scaly patch with erythematous raised borders on upper R breast (nurse chaperone present during exam)   No Known Allergies  Past Medical History:  Diagnosis Date   Dysgraphia 11/29/2015   Social History   Socioeconomic History   Marital status: Single    Spouse name: Not on file   Number of children: Not on file   Years of education: Not on file   Highest education level: Not on file  Occupational History   Not on file  Tobacco Use   Smoking status: Never   Smokeless tobacco: Never  Vaping Use    Vaping status: Never Used  Substance and Sexual Activity   Alcohol use: No    Alcohol/week: 0.0 standard drinks of alcohol   Drug use: No   Sexual activity: Not Currently    Partners: Male    Birth control/protection: Pill  Other Topics Concern   Not on file  Social History Narrative   Not on file   Social Drivers of Health   Financial Resource Strain: Not on file  Food Insecurity: Not on file  Transportation Needs: Not on file  Physical Activity: Not on file  Stress: Not on file  Social Connections: Not on file  Intimate Partner Violence: Not on file   History reviewed. No pertinent family history. History reviewed. No pertinent surgical history.   Rolinda Rogue, MD 01/21/24 1012

## 2024-02-23 ENCOUNTER — Other Ambulatory Visit (HOSPITAL_COMMUNITY): Payer: Self-pay

## 2024-02-23 DIAGNOSIS — S93421A Sprain of deltoid ligament of right ankle, initial encounter: Secondary | ICD-10-CM | POA: Diagnosis not present

## 2024-02-23 MED ORDER — LISDEXAMFETAMINE DIMESYLATE 70 MG PO CAPS
70.0000 mg | ORAL_CAPSULE | Freq: Every morning | ORAL | 0 refills | Status: AC
  Filled 2024-02-23 – 2024-02-27 (×2): qty 90, 90d supply, fill #0

## 2024-02-27 ENCOUNTER — Other Ambulatory Visit: Payer: Self-pay

## 2024-03-30 DIAGNOSIS — Z0000 Encounter for general adult medical examination without abnormal findings: Secondary | ICD-10-CM | POA: Diagnosis not present

## 2024-03-30 DIAGNOSIS — F900 Attention-deficit hyperactivity disorder, predominantly inattentive type: Secondary | ICD-10-CM | POA: Diagnosis not present

## 2024-03-30 DIAGNOSIS — Z23 Encounter for immunization: Secondary | ICD-10-CM | POA: Diagnosis not present

## 2024-05-05 ENCOUNTER — Other Ambulatory Visit (HOSPITAL_COMMUNITY): Payer: Self-pay

## 2024-05-05 DIAGNOSIS — B349 Viral infection, unspecified: Secondary | ICD-10-CM | POA: Diagnosis not present

## 2024-05-05 DIAGNOSIS — Z03818 Encounter for observation for suspected exposure to other biological agents ruled out: Secondary | ICD-10-CM | POA: Diagnosis not present

## 2024-05-05 DIAGNOSIS — J45909 Unspecified asthma, uncomplicated: Secondary | ICD-10-CM | POA: Diagnosis not present

## 2024-05-05 DIAGNOSIS — J029 Acute pharyngitis, unspecified: Secondary | ICD-10-CM | POA: Diagnosis not present

## 2024-05-05 DIAGNOSIS — R051 Acute cough: Secondary | ICD-10-CM | POA: Diagnosis not present

## 2024-05-05 MED ORDER — PROMETHAZINE-DM 6.25-15 MG/5ML PO SYRP
5.0000 mL | ORAL_SOLUTION | Freq: Every day | ORAL | 0 refills | Status: AC
  Filled 2024-05-05: qty 50, 10d supply, fill #0

## 2024-05-05 MED ORDER — PREDNISONE 50 MG PO TABS
ORAL_TABLET | ORAL | 0 refills | Status: AC
  Filled 2024-05-05: qty 5, 5d supply, fill #0

## 2024-05-29 ENCOUNTER — Other Ambulatory Visit (HOSPITAL_COMMUNITY): Payer: Self-pay

## 2024-05-31 ENCOUNTER — Other Ambulatory Visit (HOSPITAL_COMMUNITY): Payer: Self-pay

## 2024-05-31 MED ORDER — LISDEXAMFETAMINE DIMESYLATE 70 MG PO CAPS
70.0000 mg | ORAL_CAPSULE | Freq: Every day | ORAL | 0 refills | Status: DC
  Filled 2024-05-31: qty 90, 90d supply, fill #0

## 2024-08-28 ENCOUNTER — Other Ambulatory Visit (HOSPITAL_COMMUNITY): Payer: Self-pay

## 2024-08-30 ENCOUNTER — Other Ambulatory Visit (HOSPITAL_COMMUNITY): Payer: Self-pay

## 2024-08-30 ENCOUNTER — Other Ambulatory Visit: Payer: Self-pay

## 2024-08-30 MED ORDER — LISDEXAMFETAMINE DIMESYLATE 70 MG PO CAPS
70.0000 mg | ORAL_CAPSULE | Freq: Every day | ORAL | 0 refills | Status: AC
  Filled 2024-08-30 (×3): qty 90, 90d supply, fill #0

## 2024-08-31 ENCOUNTER — Other Ambulatory Visit (HOSPITAL_COMMUNITY): Payer: Self-pay

## 2024-08-31 ENCOUNTER — Other Ambulatory Visit: Payer: Self-pay

## 2024-09-01 ENCOUNTER — Other Ambulatory Visit (HOSPITAL_BASED_OUTPATIENT_CLINIC_OR_DEPARTMENT_OTHER): Payer: Self-pay
